# Patient Record
Sex: Female | Born: 1990 | Race: White | Hispanic: No | Marital: Single | Smoking: Never smoker
Health system: Southern US, Community
[De-identification: ages and names within clinical notes are randomized; demographics above are authoritative.]

## PROBLEM LIST (undated history)

## (undated) DIAGNOSIS — U071 COVID-19: Secondary | ICD-10-CM

## (undated) DIAGNOSIS — Z789 Other specified health status: Secondary | ICD-10-CM

## (undated) HISTORY — DX: Other specified health status: Z78.9

---
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
## 2011-04-05 ENCOUNTER — Emergency Department
Admission: EM | Admit: 2011-04-05 | Discharge: 2011-04-05 | Disposition: A | Discharge status: Home / Self Care | Payer: Federal, State, Local not specified - PPO | Source: Home / Self Care | Attending: Emergency Medicine | Admitting: Emergency Medicine

## 2011-04-05 ENCOUNTER — Encounter: Payer: Self-pay | Admitting: *Deleted

## 2011-04-05 ENCOUNTER — Other Ambulatory Visit: Payer: Self-pay | Admitting: Emergency Medicine

## 2011-04-05 DIAGNOSIS — J029 Acute pharyngitis, unspecified: Secondary | ICD-10-CM

## 2011-04-05 LAB — POCT RAPID STREP A (OFFICE): Rapid Strep A Screen: NEGATIVE

## 2011-04-05 NOTE — ED Notes (Signed)
Pt c/o sore throat, HA,and sinus problems x 2 days. She also states that she vommited x 1 and was dizzy this morning, resolved after taking a nap. No fever. She has taken Mucinex with no relief.

## 2011-04-05 NOTE — ED Provider Notes (Signed)
History     CSN: [id number] Arrival date & time: 04/05/2011  4:07 PM   First MD Initiated Contact with Patient 04/05/11 1616      Chief Complaint  Patient presents with  . Sore Throat    (Consider location/radiation/quality/duration/timing/severity/associated sxs/prior treatment) Patient is a 20 y.o. female presenting with pharyngitis. The history is provided by the patient and a parent.  Sore Throat This is a new problem. The current episode started 2 days ago (3 days ago). The problem occurs constantly. The problem has not changed since onset.Pertinent negatives include no chest pain, no abdominal pain, no headaches and no shortness of breath. The symptoms are aggravated by nothing. The symptoms are relieved by nothing. She has tried nothing for the symptoms.    History reviewed. No pertinent past medical history.  History reviewed. No pertinent past surgical history.  No family history on file.  History  Substance Use Topics  . Smoking status: Not on file  . Smokeless tobacco: Not on file  . Alcohol Use: Not on file    OB History    Grav Para Term Preterm Abortions TAB SAB Ect Mult Living                  Review of Systems  Constitutional: Negative for fever and chills.  HENT: Positive for congestion. Negative for rhinorrhea.   Eyes: Negative for discharge.  Respiratory: Negative for shortness of breath.   Cardiovascular: Negative for chest pain.  Gastrointestinal: Negative for abdominal pain and abdominal distention.       Vom x 1 this am. No abdom pain or other gi symptoms.  Genitourinary: Negative.   Musculoskeletal: Negative for back pain, joint swelling and arthralgias.  Neurological: Negative for headaches.    Allergies  Review of patient's allergies indicates no known allergies.  Home Medications  No current outpatient prescriptions on file.  BP 118/78  Pulse 92  Temp(Src) 98.7 F (37.1 C) (Oral)  Resp 18  Ht 5\' 5"  (1.651 m)  Wt 176 lb 8 oz  (80.06 kg)  BMI 29.37 kg/m2  SpO2 99%  Physical Exam  Nursing note and vitals reviewed. Constitutional: She is oriented to person, place, and time. She appears well-developed and well-nourished. She is cooperative.  Non-toxic appearance. No distress.  HENT:  Head: Normocephalic and atraumatic.  Right Ear: Tympanic membrane, external ear and ear canal normal.  Left Ear: Tympanic membrane, external ear and ear canal normal.  Nose: Nose normal. Right sinus exhibits no maxillary sinus tenderness and no frontal sinus tenderness. Left sinus exhibits no maxillary sinus tenderness and no frontal sinus tenderness.  Mouth/Throat: Mucous membranes are normal. Posterior oropharyngeal edema and posterior oropharyngeal erythema present. No oropharyngeal exudate.  Eyes: Conjunctivae are normal. No scleral icterus.  Neck: Neck supple.  Cardiovascular: Normal rate, regular rhythm and normal heart sounds.   No murmur heard. Pulmonary/Chest: Effort normal and breath sounds normal. No stridor. No respiratory distress. She has no wheezes. She has no rales.  Abdominal: Soft. There is no hepatosplenomegaly. There is no tenderness.  Musculoskeletal: She exhibits no edema.  Lymphadenopathy:    She has cervical adenopathy.       Right cervical: Superficial cervical adenopathy present. No deep cervical and no posterior cervical adenopathy present.      Left cervical: Superficial cervical adenopathy present. No deep cervical and no posterior cervical adenopathy present.  Neurological: She is alert and oriented to person, place, and time.  Skin: Skin is warm and dry.  Psychiatric:  She has a normal mood and affect.    ED Course  Procedures (including critical care time)   Labs Reviewed  POCT RAPID STREP A (OFFICE)  Negative  No results found.   No diagnosis found.    MDM  Likely viral pharyngitiis. Send off throat cx. Symptomatic care discussed with mother. If no better in 4 days, see PCP. May need  mono test. Options discussed. Risks, benefits, alternatives discussed. Patient voiced understanding and agreement.        Lonell Face, MD 04/05/11 [phone]

## 2011-04-06 LAB — STREP A DNA PROBE: GASP: NEGATIVE

## 2011-09-09 ENCOUNTER — Emergency Department
Admission: EM | Admit: 2011-09-09 | Discharge: 2011-09-09 | Disposition: A | Discharge status: Home / Self Care | Payer: Federal, State, Local not specified - PPO | Source: Home / Self Care | Attending: Emergency Medicine | Admitting: Emergency Medicine

## 2011-09-09 ENCOUNTER — Encounter: Payer: Self-pay | Admitting: Emergency Medicine

## 2011-09-09 DIAGNOSIS — J029 Acute pharyngitis, unspecified: Secondary | ICD-10-CM

## 2011-09-09 DIAGNOSIS — J069 Acute upper respiratory infection, unspecified: Secondary | ICD-10-CM

## 2011-09-09 LAB — POCT INFLUENZA A/B: Influenza B, POC: NEGATIVE

## 2011-09-09 MED ORDER — AMOXICILLIN 875 MG PO TABS: 875.0000 mg | ORAL_TABLET | Freq: Two times a day (BID) | ORAL | Status: AC

## 2011-09-09 NOTE — ED Provider Notes (Signed)
History     CSN: [id number]  Arrival date & time 09/09/11  1755   First MD Initiated Contact with Patient 09/09/11 1759      Chief Complaint  Patient presents with  . Sore Throat    (Consider location/radiation/quality/duration/timing/severity/associated sxs/prior treatment) HPI Katrina Robles is a 21 y.o. female who complains of onset of cold symptoms for 1 days.  The symptoms are constant and moderate in severity.  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  She did have a flu shot this year.  She has not been using any medications.  She has a history of multiple episodes of strep throat in the past. ++ sore throat +/- cough No pleuritic pain No wheezing No nasal congestion No post-nasal drainage No sinus pain/pressure No chest congestion No itchy/red eyes No earache No hemoptysis No SOB No chills/sweats + fever (102-103) No nausea No vomiting No abdominal pain No diarrhea No skin rashes + fatigue No myalgias + headache    History reviewed. No pertinent past medical history.  History reviewed. No pertinent past surgical history.  Family History  Problem Relation Age of Onset  . Polycystic ovary syndrome Mother     History  Substance Use Topics  . Smoking status: Never Smoker   . Smokeless tobacco: Not on file  . Alcohol Use: No    OB History    Grav Para Term Preterm Abortions TAB SAB Ect Mult Living                  Review of Systems  All other systems reviewed and are negative.    Allergies  Review of patient's allergies indicates not on file.  Home Medications   Current Outpatient Rx  Name Route Sig Dispense Refill  . AMOXICILLIN 875 MG PO TABS Oral Take 1 tablet (875 mg total) by mouth 2 (two) times daily. 14 tablet 0    BP 102/68  Pulse 117  Temp(Src) 102.6 F (39.2 C) (Oral)  Resp 18  Ht 5\' 5"  (1.651 m)  Wt 176 lb (79.833 kg)  BMI 29.29 kg/m2  SpO2 99%  Physical Exam  Nursing note and vitals  reviewed. Constitutional: She is oriented to person, place, and time. She appears well-developed and well-nourished.  Non-toxic appearance. She appears ill. No distress.  HENT:  Head: Normocephalic and atraumatic.  Right Ear: Tympanic membrane, external ear and ear canal normal.  Left Ear: Tympanic membrane, external ear and ear canal normal.  Nose: Nose normal.  Mouth/Throat: Posterior oropharyngeal erythema present. No oropharyngeal exudate or posterior oropharyngeal edema.  Eyes: No scleral icterus.  Neck: Neck supple.  Cardiovascular: Regular rhythm and normal heart sounds.   Pulmonary/Chest: Effort normal and breath sounds normal. No respiratory distress. She has no decreased breath sounds. She has no wheezes. She has no rhonchi.  Neurological: She is alert and oriented to person, place, and time.  Skin: Skin is warm and dry.  Psychiatric: She has a normal mood and affect. Her speech is normal.    ED Course  Procedures (including critical care time)  Labs Reviewed - No data to display No results found.   1. Acute upper respiratory infections of unspecified site   2. Acute pharyngitis       MDM   A rapid strep test done was negative.  Flu test was done and is negative as well.  Her symptoms are consistent with strep throat despite the negative test.  Take the prescribed antibiotic as instructed.  She declined a Bicillin shot. Use nasal saline solution (over the counter) at least 3 times a day. Can take tylenol every 6 hours or motrin every 8 hours for pain or fever.  If the pain with swallowing continues for more than another day or 2, then a prescription for prednisone may be reasonable.  In addition if the symptoms continue despite the antibiotics then a Monospot may also be reasonable at that time. Follow up with your primary doctor if no improvement in 5-7 days, sooner if increasing pain, fever, or new symptoms.     Marlaine Hind, MD 09/09/11 [phone]

## 2011-09-09 NOTE — ED Notes (Signed)
Sore throat, fever since last night.

## 2011-11-05 ENCOUNTER — Other Ambulatory Visit: Payer: Self-pay | Admitting: Neurological Surgery

## 2011-11-05 DIAGNOSIS — D497 Neoplasm of unspecified behavior of endocrine glands and other parts of nervous system: Secondary | ICD-10-CM

## 2012-05-01 ENCOUNTER — Encounter: Payer: Self-pay | Admitting: Family Medicine

## 2012-05-01 ENCOUNTER — Ambulatory Visit (INDEPENDENT_AMBULATORY_CARE_PROVIDER_SITE_OTHER): Payer: Federal, State, Local not specified - PPO | Admitting: Family Medicine

## 2012-05-01 VITALS — BP 102/60 | HR 97 | Ht 65.0 in | Wt 175.0 lb

## 2012-05-01 DIAGNOSIS — Z23 Encounter for immunization: Secondary | ICD-10-CM

## 2012-05-01 DIAGNOSIS — R21 Rash and other nonspecific skin eruption: Secondary | ICD-10-CM

## 2012-05-01 MED ORDER — PREDNISONE 20 MG PO TABS: 20.0000 mg | ORAL_TABLET | Freq: Two times a day (BID) | ORAL | Status: DC

## 2012-05-01 NOTE — Progress Notes (Signed)
  Subjective:    Patient ID: Katrina Robles, female    DOB: 1991-01-02, 21 y.o.   MRN: [id number]  HPI Rash x 2 weeks. Some itching. Tried antihistamine but no relief. Not on any medications. Hx of eczema.  She denies any new perfumes, soaps, detergents etc.   Review of Systems     Objective:   Physical Exam  Constitutional: She appears well-developed and well-nourished.  HENT:  Head: Normocephalic and atraumatic.  Skin: Skin is warm and dry.       She has fine scattered erythematous papules and pustules on her torso, legs, arms. None on her feet or face. Some excoriations.  Psychiatric: She has a normal mood and affect. Her behavior is normal.   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        Assessment & Plan:  Rash - suspect postural eczema versus allergic reaction. Discussed going to dye free and perfumes creams soaps, detergents, lotions et Karie Soda. Also make sure moisturizing the skin well. We will also get a CBC with differential to see if her cycles are elevated. We'll go ahead and treat with prednisone 40 mg daily x5 days. Call if symptoms not resolved.

## 2012-05-01 NOTE — Addendum Note (Signed)
Addended by: Judie Petit A on: 05/01/2012 03:15 PM   Modules accepted: Orders

## 2012-05-01 NOTE — Patient Instructions (Addendum)
Change to a dye free and perfume free soap such as pHisoDerm, or Aveeno. Also recommend change to a coffee perfume free lotion. TheAveeno and Cetaphil make good once. Avoid using puff sponges or scrubers. Use a clean wash cloth each time and make sure washing it in hot water.  Go to a dye free and perfume free detergent and fabric softener.

## 2012-05-02 LAB — CBC WITH DIFFERENTIAL/PLATELET
Basophils Relative: 0 % (ref 0–1)
Eosinophils Absolute: 0.3 10*3/uL (ref 0.0–0.7)
Eosinophils Relative: 3 % (ref 0–5)
HCT: 41 % (ref 36.0–46.0)
Hemoglobin: 13.5 g/dL (ref 12.0–15.0)
MCH: 28.8 pg (ref 26.0–34.0)
MCHC: 32.9 g/dL (ref 30.0–36.0)
MCV: 87.4 fL (ref 78.0–100.0)
Monocytes Absolute: 0.7 10*3/uL (ref 0.1–1.0)
Monocytes Relative: 9 % (ref 3–12)

## 2012-06-12 ENCOUNTER — Telehealth: Payer: Self-pay | Admitting: *Deleted

## 2012-06-12 NOTE — Telephone Encounter (Signed)
Mom calls and wants to know if you can prescribe a med to treat scabies. The rash that both her and daughter had is scabies- her nephew that lives with them was just diagnosed with scabies.

## 2012-06-13 MED ORDER — PERMETHRIN 5 % EX CREA: TOPICAL_CREAM | Freq: Once | CUTANEOUS | Status: DC

## 2012-06-13 NOTE — Telephone Encounter (Signed)
rx sent

## 2012-06-13 NOTE — Addendum Note (Signed)
Addended by: Nani Gasser D on: 06/13/2012 12:22 PM   Modules accepted: Orders

## 2018-05-31 NOTE — L&D Delivery Note (Signed)
Delivery Note   Patient Name: Katrina Robles DOB: November 08, 1990 MRN: [id number]  Date of admission: 05/18/2019 Delivering MD: Noralyn Pick  Date of delivery: 05/19/19 Type of delivery: SVD  Newborn Data: Live born female  Birth Weight:   APGAR: 8, 9  Newborn Delivery   Birth date/time: 05/19/2019 08:56:57 Delivery type: Vaginal, Spontaneous     [name], 28 y.o., @ [redacted]w[redacted]d,  G1P0, who was admitted for IOL for obesity BMI 42, pt has covid+ for x 1 week. I was called to the room when she progressed +2 station in the second stage of labor.  She pushed for 45/min.  She delivered a viable infant, cephalic and restituted to the ROA position over an intact perineum.  A nuchal cord   was identified around neck and body together. The baby was placed on maternal abdomen while initial step of NRP were perfmored (Dry, Stimulated, and warmed). Hat placed on baby for thermoregulation. Delayed cord clamping was performed for 1.5 minutes.  Cord double clamped and cut.  Cord cut by father. Apgar scores were 9 and 9. Prophylactic Pitocin was started in the third stage of labor for active management. The placenta delivered spontaneously, shultz, with a 3 vessel cord and was sent to LD.  Inspection revealed 2nd degree and right perilabial. The perilabial was hemostatic and did not require a repair, the second degree could have had a sulcus tear component to it, repair required, rectal exam performed, rectus intact and sutures not felt in the rectum. An examination of the vaginal vault and cervix was free from lacerations. The uterus was firm, bleeding stable.  The repair was done under epidural.   Placenta and umbilical artery blood gas were not sent.  There were no complications during the procedure.  Mom and baby skin to skin following delivery. Left in stable condition.  Maternal Info: Anesthesia: Epidural Episiotomy: no Lacerations:  2nd degree, right perilabial Suture Repair: 3.0  vicry CT-1 Est. Blood Loss (mL):  376  Newborn Info:  Baby Sex: femal Babies Name: Lillianna APGAR (1 MIN): 9   APGAR (5 MINS): 9   APGAR (10 MINS):     Mom to postpartum.  Baby to Couplet care / Skin to Skin.  DR Nelda Marseille aware of birth    [place], [place], NP-C 05/19/19 10:01 AM

## 2018-12-13 LAB — OB RESULTS CONSOLE ANTIBODY SCREEN: Antibody Screen: NEGATIVE

## 2018-12-13 LAB — OB RESULTS CONSOLE HIV ANTIBODY (ROUTINE TESTING): HIV: NONREACTIVE

## 2018-12-13 LAB — OB RESULTS CONSOLE ABO/RH: RH Type: POSITIVE

## 2018-12-13 LAB — OB RESULTS CONSOLE HEPATITIS B SURFACE ANTIGEN: Hepatitis B Surface Ag: NEGATIVE

## 2018-12-13 LAB — OB RESULTS CONSOLE GC/CHLAMYDIA
Chlamydia: POSITIVE
Gonorrhea: NEGATIVE

## 2018-12-13 LAB — OB RESULTS CONSOLE RUBELLA ANTIBODY, IGM: Rubella: IMMUNE

## 2018-12-13 LAB — OB RESULTS CONSOLE RPR: RPR: NONREACTIVE

## 2019-04-13 LAB — OB RESULTS CONSOLE GBS: GBS: NEGATIVE

## 2019-05-04 LAB — OB RESULTS CONSOLE GC/CHLAMYDIA
Chlamydia: NEGATIVE
Gonorrhea: NEGATIVE

## 2019-05-09 ENCOUNTER — Telehealth (HOSPITAL_COMMUNITY): Payer: Self-pay | Admitting: *Deleted

## 2019-05-09 ENCOUNTER — Encounter (HOSPITAL_COMMUNITY): Payer: Self-pay | Admitting: *Deleted

## 2019-05-09 NOTE — Telephone Encounter (Signed)
Preadmission screen  

## 2019-05-10 ENCOUNTER — Telehealth (HOSPITAL_COMMUNITY): Payer: Self-pay | Admitting: *Deleted

## 2019-05-10 ENCOUNTER — Encounter (HOSPITAL_COMMUNITY): Payer: Self-pay | Admitting: *Deleted

## 2019-05-10 NOTE — Telephone Encounter (Signed)
Preadmission screen  

## 2019-05-16 ENCOUNTER — Other Ambulatory Visit: Payer: Self-pay | Admitting: Obstetrics and Gynecology

## 2019-05-16 ENCOUNTER — Other Ambulatory Visit (HOSPITAL_COMMUNITY)
Admission: RE | Admit: 2019-05-16 | Discharge: 2019-05-16 | Disposition: A | Discharge status: Home / Self Care | Payer: Medicaid Other | Source: Ambulatory Visit | Attending: Obstetrics & Gynecology | Admitting: Obstetrics & Gynecology

## 2019-05-16 DIAGNOSIS — U071 COVID-19: Secondary | ICD-10-CM | POA: Insufficient documentation

## 2019-05-16 DIAGNOSIS — Z01812 Encounter for preprocedural laboratory examination: Secondary | ICD-10-CM | POA: Diagnosis present

## 2019-05-16 LAB — SARS CORONAVIRUS 2 (TAT 6-24 HRS): SARS Coronavirus 2: POSITIVE — AB

## 2019-05-17 NOTE — Progress Notes (Signed)
Attempt x3 to speak to someone form the office of Dr. Viona Gilmore. [name] regarding a positive covid result for this pt. LVM with the assistant of Dr. [name].

## 2019-05-18 ENCOUNTER — Other Ambulatory Visit: Payer: Self-pay

## 2019-05-18 ENCOUNTER — Encounter (HOSPITAL_COMMUNITY): Payer: Self-pay | Admitting: Obstetrics and Gynecology

## 2019-05-18 ENCOUNTER — Inpatient Hospital Stay (HOSPITAL_COMMUNITY): Payer: Medicaid Other

## 2019-05-18 ENCOUNTER — Encounter: Payer: Self-pay | Admitting: Medical

## 2019-05-18 ENCOUNTER — Inpatient Hospital Stay (HOSPITAL_COMMUNITY)
Admission: AD | Admit: 2019-05-18 | Discharge: 2019-05-20 | DRG: 805 | Disposition: A | Discharge status: Home / Self Care | Payer: Medicaid Other | Attending: Obstetrics & Gynecology | Admitting: Obstetrics & Gynecology

## 2019-05-18 DIAGNOSIS — Z3A40 40 weeks gestation of pregnancy: Secondary | ICD-10-CM | POA: Diagnosis not present

## 2019-05-18 DIAGNOSIS — O99344 Other mental disorders complicating childbirth: Secondary | ICD-10-CM | POA: Diagnosis present

## 2019-05-18 DIAGNOSIS — O9852 Other viral diseases complicating childbirth: Secondary | ICD-10-CM | POA: Diagnosis present

## 2019-05-18 DIAGNOSIS — F419 Anxiety disorder, unspecified: Secondary | ICD-10-CM | POA: Diagnosis present

## 2019-05-18 DIAGNOSIS — U071 COVID-19: Secondary | ICD-10-CM | POA: Diagnosis present

## 2019-05-18 DIAGNOSIS — O99214 Obesity complicating childbirth: Principal | ICD-10-CM | POA: Diagnosis present

## 2019-05-18 DIAGNOSIS — O99210 Obesity complicating pregnancy, unspecified trimester: Secondary | ICD-10-CM | POA: Diagnosis present

## 2019-05-18 DIAGNOSIS — O99213 Obesity complicating pregnancy, third trimester: Secondary | ICD-10-CM

## 2019-05-18 HISTORY — DX: Obesity complicating pregnancy, third trimester: O99.213

## 2019-05-18 LAB — TYPE AND SCREEN
ABO/RH(D): A POS
Antibody Screen: NEGATIVE

## 2019-05-18 LAB — RPR: RPR Ser Ql: NONREACTIVE

## 2019-05-18 LAB — COMPREHENSIVE METABOLIC PANEL
ALT: 19 U/L (ref 0–44)
AST: 16 U/L (ref 15–41)
Albumin: 2.5 g/dL — ABNORMAL LOW (ref 3.5–5.0)
Alkaline Phosphatase: 149 U/L — ABNORMAL HIGH (ref 38–126)
Anion gap: 12 (ref 5–15)
BUN: 8 mg/dL (ref 6–20)
CO2: 16 mmol/L — ABNORMAL LOW (ref 22–32)
Calcium: 8.7 mg/dL — ABNORMAL LOW (ref 8.9–10.3)
Chloride: 108 mmol/L (ref 98–111)
Creatinine, Ser: 0.59 mg/dL (ref 0.44–1.00)
GFR calc Af Amer: >60 mL/min (ref >60)
GFR calc non Af Amer: >60 mL/min (ref >60)
Glucose, Bld: 84 mg/dL (ref 70–99)
Potassium: 4.2 mmol/L (ref 3.5–5.1)
Sodium: 136 mmol/L (ref 135–145)
Total Bilirubin: 0.4 mg/dL (ref 0.3–1.2)
Total Protein: 5.7 g/dL — ABNORMAL LOW (ref 6.5–8.1)

## 2019-05-18 LAB — CBC
HCT: 33.1 % — ABNORMAL LOW (ref 36.0–46.0)
Hemoglobin: 10.6 g/dL — ABNORMAL LOW (ref 12.0–15.0)
MCH: 27.8 pg (ref 26.0–34.0)
MCHC: 32 g/dL (ref 30.0–36.0)
MCV: 86.9 fL (ref 80.0–100.0)
Platelets: 198 10*3/uL (ref 150–400)
RBC: 3.81 MIL/uL — ABNORMAL LOW (ref 3.87–5.11)
RDW: 13.4 % (ref 11.5–15.5)
WBC: 5.9 10*3/uL (ref 4.0–10.5)
nRBC: 0 % (ref 0.0–0.2)

## 2019-05-18 LAB — PROTEIN / CREATININE RATIO, URINE
Creatinine, Urine: 24.58 mg/dL
Total Protein, Urine: <6 mg/dL

## 2019-05-18 LAB — ABO/RH: ABO/RH(D): A POS

## 2019-05-18 MED ORDER — DIPHENHYDRAMINE HCL 50 MG/ML IJ SOLN: 12.5000 mg | INTRAMUSCULAR | Status: DC | PRN

## 2019-05-18 MED ORDER — OXYTOCIN BOLUS FROM INFUSION
500.0000 mL | Freq: Once | INTRAVENOUS | Status: AC
  Administered 2019-05-19: 500 mL via INTRAVENOUS

## 2019-05-18 MED ORDER — SOD CITRATE-CITRIC ACID 500-334 MG/5ML PO SOLN: 30.0000 mL | ORAL | Status: DC | PRN

## 2019-05-18 MED ORDER — BUTORPHANOL TARTRATE 1 MG/ML IJ SOLN
2.0000 mg | INTRAMUSCULAR | Status: DC | PRN
  Administered 2019-05-18: 2 mg via INTRAVENOUS
  Filled 2019-05-18: qty 2

## 2019-05-18 MED ORDER — MISOPROSTOL 25 MCG QUARTER TABLET
25.0000 ug | ORAL_TABLET | ORAL | Status: DC | PRN
  Administered 2019-05-18 (×5): 25 ug via VAGINAL
  Filled 2019-05-18 (×4): qty 1

## 2019-05-18 MED ORDER — LACTATED RINGERS IV SOLN
INTRAVENOUS | Status: DC
  Administered 2019-05-18 – 2019-05-19 (×2): 125 mL/h via INTRAVENOUS

## 2019-05-18 MED ORDER — FENTANYL-BUPIVACAINE-NACL 0.5-0.125-0.9 MG/250ML-% EP SOLN
12.0000 mL/h | EPIDURAL | Status: DC | PRN
  Filled 2019-05-18: qty 250

## 2019-05-18 MED ORDER — ACETAMINOPHEN 325 MG PO TABS: 650.0000 mg | ORAL_TABLET | ORAL | Status: DC | PRN

## 2019-05-18 MED ORDER — OXYTOCIN 40 UNITS IN NORMAL SALINE INFUSION - SIMPLE MED
2.5000 [IU]/h | INTRAVENOUS | Status: DC
  Filled 2019-05-18: qty 1000

## 2019-05-18 MED ORDER — EPHEDRINE 5 MG/ML INJ: 10.0000 mg | INTRAVENOUS | Status: DC | PRN

## 2019-05-18 MED ORDER — LACTATED RINGERS IV SOLN
500.0000 mL | Freq: Once | INTRAVENOUS | Status: AC
  Administered 2019-05-19: 500 mL via INTRAVENOUS

## 2019-05-18 MED ORDER — TERBUTALINE SULFATE 1 MG/ML IJ SOLN: 0.2500 mg | Freq: Once | INTRAMUSCULAR | Status: DC | PRN

## 2019-05-18 MED ORDER — OXYCODONE-ACETAMINOPHEN 5-325 MG PO TABS: 1.0000 | ORAL_TABLET | ORAL | Status: DC | PRN

## 2019-05-18 MED ORDER — OXYTOCIN 40 UNITS IN NORMAL SALINE INFUSION - SIMPLE MED
1.0000 m[IU]/min | INTRAVENOUS | Status: DC
  Administered 2019-05-19: 1 m[IU]/min via INTRAVENOUS
  Filled 2019-05-18: qty 1000

## 2019-05-18 MED ORDER — ONDANSETRON HCL 4 MG/2ML IJ SOLN: 4.0000 mg | Freq: Four times a day (QID) | INTRAMUSCULAR | Status: DC | PRN

## 2019-05-18 MED ORDER — OXYCODONE-ACETAMINOPHEN 5-325 MG PO TABS: 2.0000 | ORAL_TABLET | ORAL | Status: DC | PRN

## 2019-05-18 MED ORDER — BUTORPHANOL TARTRATE 1 MG/ML IJ SOLN
2.0000 mg | Freq: Once | INTRAMUSCULAR | Status: AC
  Administered 2019-05-18: 2 mg via INTRAVENOUS
  Filled 2019-05-18: qty 2

## 2019-05-18 MED ORDER — MISOPROSTOL 25 MCG QUARTER TABLET
ORAL_TABLET | ORAL | Status: AC
  Filled 2019-05-18: qty 1

## 2019-05-18 MED ORDER — FENTANYL CITRATE (PF) 100 MCG/2ML IJ SOLN: 50.0000 ug | INTRAMUSCULAR | Status: DC | PRN

## 2019-05-18 MED ORDER — LIDOCAINE HCL (PF) 1 % IJ SOLN
30.0000 mL | INTRAMUSCULAR | Status: DC | PRN
  Filled 2019-05-18: qty 30

## 2019-05-18 MED ORDER — PHENYLEPHRINE 40 MCG/ML (10ML) SYRINGE FOR IV PUSH (FOR BLOOD PRESSURE SUPPORT): 80.0000 ug | PREFILLED_SYRINGE | INTRAVENOUS | Status: DC | PRN

## 2019-05-18 MED ORDER — LACTATED RINGERS IV SOLN: 500.0000 mL | INTRAVENOUS | Status: DC | PRN

## 2019-05-18 MED ORDER — PHENYLEPHRINE 40 MCG/ML (10ML) SYRINGE FOR IV PUSH (FOR BLOOD PRESSURE SUPPORT)
80.0000 ug | PREFILLED_SYRINGE | INTRAVENOUS | Status: DC | PRN
  Filled 2019-05-18: qty 10

## 2019-05-18 NOTE — Progress Notes (Signed)
Katrina Robles is a 28 y.o. G1P0 at [redacted]w[redacted]d. IOL for obesity.  Cytotec given x 5 doses, tolerated well. COVID pos 12/16. Mild symptoms to include congestion  Subjective: Pt rested well today. Mild contractions  Objective: BP 117/72   Pulse 69   Temp 98.2 F (36.8 C) (Oral)   Resp 18   Ht 5\' 5"  (1.651 m)   Wt 115.3 kg   LMP 07/11/2018   BMI 42.28 kg/m   FHT:  Cat 1 tracing UC:   irregular, every 5 minutes SVE:   Dilation: 1 Effacement (%): 70 Station: -2 Exam by:: Reola Buckles CNM Bedside performed by J. Zoha Spranger for presentation. Vertex  Pt pre-medicated with stadol. Cooks catheter inserted. Inflated with 80/80. PT tolerated well.   Assessment / Plan: Cooks Catheter inserted without difficulty. Inflated 80/80 Begin ripening pitocin at 0000 Pain control as desires  Beatrix Fetters, CNM 05/18/2019, 8:42 PM

## 2019-05-18 NOTE — Progress Notes (Signed)
Subjective: Pt is sleeping throughout night.  Denies pain.  Objective: BP (!) 102/57   Pulse 65   Temp 97.9 F (36.6 C) (Oral)   Resp 18   Ht 5\' 5"  (1.651 m)   Wt 115.3 kg   LMP 07/11/2018   BMI 42.28 kg/m  No intake/output data recorded. No intake/output data recorded.  FHT: Category 1 FHT 120 accels noted.  No decels. UC:   None SVE:   Dilation: Closed Effacement (%): Thick Station: -3 Exam by:: Javier Docker RN   Assessment:  28 yo G1P0 at 40 weeks induction for increased BMI Cat 1 strip Covid positive  Plan: Continue with cytotec  Starla Link CNM, MSN 05/18/2019, 6:46 AM

## 2019-05-18 NOTE — H&P (Addendum)
Katrina Robles is a 28 y.o. female, G1P0 at 12 weeks, presenting for induction of labor due to increased BMI.  Prenatal hx remarkable for late entry into prenatal care at 64 weeks with unknown LMP.  US showed EDC of 05/18/2019 at 17 4/7.  Pt was found to be Covid positive on preadmission labs.  She had Clamydia during the pregnancy but toc and at 36 weeks the culture was negative. She is a smoker and has anxiety.  She is on no current medication for her anxiety.  FM+  Patient Active Problem List   Diagnosis Date Noted  . 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 IRREGULAR MENSES 07/31/2008    History of present pregnancy: Patient entered care at 17.4 weeks.   EDC of 05/18/2019 was established by [place].   Anatomy scan:  21.4 weeks, with normal findings and an posterior placenta.   Additional [place] evaluations: growth [place]  At 33 weeks 53% with normal AFI Significant prenatal events:  Covid positive on 05/16/2019 Last evaluation: Yesterday  OB History    Gravida  1   Para      Term      Preterm      AB      Living        SAB      TAB      Ectopic      Multiple      Live Births             Past Medical History:  Diagnosis Date  . Medical history non-contributory    Past Surgical History:  Procedure Laterality Date  . NO PAST SURGERIES     Family History: family history includes Polycystic ovary syndrome in her mother. Social History:  reports that she has never smoked. She has never used smokeless tobacco. She reports that she does not drink alcohol or use drugs.   Prenatal Transfer Tool  Maternal Diabetes: No Genetic Screening: Normal Maternal Ultrasounds/Referrals: Normal Fetal Ultrasounds or other Referrals:  None Maternal Substance Abuse:  Yes:  Type: Smoker Significant Maternal Medications:  None Significant Maternal Lab Results: Group B Strep negative  COVID positive  TDAP Yes Flu Yes  ROS: All 10 systems reviewed and negative except as stated above No  Known Allergies     Blood pressure (!) 144/75, pulse 88, temperature 98.2 F (36.8 C), temperature source Oral, resp. rate 20, height 5\' 5"  (1.651 m), weight 115.3 kg, last menstrual period 07/11/2018.  Chest clear Heart RRR without murmur Abd gravid, NT, FH appropriate Pelvic:Per RN Ext: Negative  FHR: Category 1  FHT 120 accels noted no decels  UCs:  Occ contractions.  Prenatal labs: ABO, Rh: A/Positive/-- (07/15 0000) Antibody: Negative (07/15 0000) Rubella:  Immune (07/15 0000) RPR: Nonreactive (07/15 0000)  HBsAg: Negative (07/15 0000)  HIV: Non-reactive (07/15 0000)  GBS: Negative/-- (11/13 0000) Sickle cell/Hgb electrophoresis:AA GC: Neg Chlamydia: positive repeat at 36 weeks negative Genetic screenings:  NIPT negative AFP negative Glucola:  120 Other:   Hgb 12.2 at NOB, 11 at 28 weeks       Assessment/Plan: Pt is a 28 yo G1P0 at 40 weeks with BMI of 42.28 presents for induction of labor due to increased BMI Cat 1 strip Covid positive   Plan: Admit to [facility] per [facility] Routine [facility] orders Negative pressure room Pain med/epidural prn Cytotec for cervical ripening  Pleas Koch ProtheroCNM, MSN 05/18/2019, 12:40 AM

## 2019-05-19 ENCOUNTER — Inpatient Hospital Stay (HOSPITAL_COMMUNITY): Payer: Medicaid Other | Admitting: Anesthesiology

## 2019-05-19 ENCOUNTER — Encounter (HOSPITAL_COMMUNITY): Payer: Self-pay | Admitting: Obstetrics and Gynecology

## 2019-05-19 MED ORDER — SODIUM CHLORIDE (PF) 0.9 % IJ SOLN
INTRAMUSCULAR | Status: DC | PRN
  Administered 2019-05-19: 12 mL/h via EPIDURAL

## 2019-05-19 MED ORDER — ONDANSETRON HCL 4 MG PO TABS: 4.0000 mg | ORAL_TABLET | ORAL | Status: DC | PRN

## 2019-05-19 MED ORDER — ACETAMINOPHEN 325 MG PO TABS
650.0000 mg | ORAL_TABLET | ORAL | Status: DC | PRN
  Administered 2019-05-19: 650 mg via ORAL
  Filled 2019-05-19: qty 2

## 2019-05-19 MED ORDER — DIBUCAINE (PERIANAL) 1 % EX OINT: 1.0000 "application " | TOPICAL_OINTMENT | CUTANEOUS | Status: DC | PRN

## 2019-05-19 MED ORDER — ZOLPIDEM TARTRATE 5 MG PO TABS: 5.0000 mg | ORAL_TABLET | Freq: Every evening | ORAL | Status: DC | PRN

## 2019-05-19 MED ORDER — TETANUS-DIPHTH-ACELL PERTUSSIS 5-2.5-18.5 LF-MCG/0.5 IM SUSP: 0.5000 mL | Freq: Once | INTRAMUSCULAR | Status: DC

## 2019-05-19 MED ORDER — LIDOCAINE-EPINEPHRINE (PF) 2 %-1:200000 IJ SOLN
INTRAMUSCULAR | Status: DC | PRN
  Administered 2019-05-19 (×2): 2 mL via EPIDURAL

## 2019-05-19 MED ORDER — BENZOCAINE-MENTHOL 20-0.5 % EX AERO: 1.0000 "application " | INHALATION_SPRAY | CUTANEOUS | Status: DC | PRN

## 2019-05-19 MED ORDER — PRENATAL MULTIVITAMIN CH
1.0000 | ORAL_TABLET | Freq: Every day | ORAL | Status: DC
  Filled 2019-05-19: qty 1

## 2019-05-19 MED ORDER — IBUPROFEN 600 MG PO TABS
600.0000 mg | ORAL_TABLET | Freq: Four times a day (QID) | ORAL | Status: DC
  Administered 2019-05-19 – 2019-05-20 (×3): 600 mg via ORAL
  Filled 2019-05-19 (×4): qty 1

## 2019-05-19 MED ORDER — WITCH HAZEL-GLYCERIN EX PADS: 1.0000 "application " | MEDICATED_PAD | CUTANEOUS | Status: DC | PRN

## 2019-05-19 MED ORDER — SENNOSIDES-DOCUSATE SODIUM 8.6-50 MG PO TABS
2.0000 | ORAL_TABLET | ORAL | Status: DC
  Administered 2019-05-19: 2 via ORAL
  Filled 2019-05-19: qty 2

## 2019-05-19 MED ORDER — ONDANSETRON HCL 4 MG/2ML IJ SOLN: 4.0000 mg | INTRAMUSCULAR | Status: DC | PRN

## 2019-05-19 MED ORDER — COCONUT OIL OIL: 1.0000 "application " | TOPICAL_OIL | Status: DC | PRN

## 2019-05-19 MED ORDER — DIPHENHYDRAMINE HCL 25 MG PO CAPS: 25.0000 mg | ORAL_CAPSULE | Freq: Four times a day (QID) | ORAL | Status: DC | PRN

## 2019-05-19 MED ORDER — SIMETHICONE 80 MG PO CHEW: 80.0000 mg | CHEWABLE_TABLET | ORAL | Status: DC | PRN

## 2019-05-19 NOTE — Progress Notes (Addendum)
Katrina Robles is a 28 y.o. G1P0 at [redacted]w[redacted]d. IOL for obesity.  Cytotec given x 5 doses, tolerated well. Cooks cath inserted at 2030, out at 0100 SROM at 0338 Epidural in place, comfortable Pitocin infusing at 2 mU COVID pos 12/16. Mild symptoms to include congestion  Subjective: Pt sleeping.   Objective: BP 119/80   Pulse 95   Temp 98.4 F (36.9 C) (Oral)   Resp 19   Ht 5\' 5"  (1.651 m)   Wt 115.3 kg   LMP 07/11/2018   SpO2 100%   BMI 42.28 kg/m   FHT:  Cat 1 tracing Baseline 120's. Mod variability, accels present, early decels present UC:   irregular, every 3-4 minutes SVE:   Dilation: 7 Effacement (%): 90 Station: -1 Exam by:: Chukwuemeka S. RNC     Assessment / Plan: Cooks now out Epidural in place Continue pitocin per protocol  [employer], CNM 05/19/2019, 6:32 AM

## 2019-05-19 NOTE — Anesthesia Postprocedure Evaluation (Signed)
Anesthesia Post Note  Patient: Katrina Robles  Procedure(s) Performed: AN AD HOC LABOR EPIDURAL     Patient location during evaluation: Mother Baby Anesthesia Type: Epidural Level of consciousness: awake and alert Pain management: pain level controlled Vital Signs Assessment: post-procedure vital signs reviewed and stable Respiratory status: spontaneous breathing, nonlabored ventilation and respiratory function stable Cardiovascular status: stable Postop Assessment: no headache, no backache and epidural receding Anesthetic complications: no    Last Vitals:  Vitals:   05/19/19 1140 05/19/19 1300  BP: 122/66 102/75  Pulse: 78 99  Resp: 18 18  Temp: 36.9 C 37.4 C  SpO2:      Last Pain:  Vitals:   05/19/19 1520  TempSrc:   PainSc: Asleep   Pain Goal:                   [name]

## 2019-05-19 NOTE — Anesthesia Preprocedure Evaluation (Addendum)
Anesthesia Evaluation  Patient identified by MRN, date of birth, ID band Patient awake    Reviewed: Allergy & Precautions, NPO status , Patient's Chart, lab work & pertinent test results  Airway Mallampati: III  TM Distance: >3 FB Neck ROM: Full    Dental no notable dental hx.    Pulmonary Current SmokerPatient did not abstain from smoking.,  COVID positive, asymptomatic   Pulmonary exam normal breath sounds clear to auscultation       Cardiovascular negative cardio ROS Normal cardiovascular exam Rhythm:Regular Rate:Normal     Neuro/Psych PSYCHIATRIC DISORDERS Anxiety negative neurological ROS     GI/Hepatic negative GI ROS, Neg liver ROS,   Endo/Other  Morbid obesity  Renal/GU negative Renal ROS  negative genitourinary   Musculoskeletal negative musculoskeletal ROS (+)   Abdominal   Peds  Hematology negative hematology ROS (+)   Anesthesia Other Findings   Reproductive/Obstetrics (+) Pregnancy                             Anesthesia Physical Anesthesia Plan  ASA: III  Anesthesia Plan: Epidural   Post-op Pain Management:    Induction:   PONV Risk Score and Plan: Treatment may vary due to age or medical condition  Airway Management Planned: Natural Airway  Additional Equipment:   Intra-op Plan:   Post-operative Plan:   Informed Consent: I have reviewed the patients History and Physical, chart, labs and discussed the procedure including the risks, benefits and alternatives for the proposed anesthesia with the patient or authorized representative who has indicated his/her understanding and acceptance.       Plan Discussed with: Anesthesiologist  Anesthesia Plan Comments: (Patient identified. Risks, benefits, options discussed with patient including but not limited to bleeding, infection, nerve damage, paralysis, failed block, incomplete pain control, headache, blood  pressure changes, nausea, vomiting, reactions to medication, itching, and post partum back pain. Confirmed with bedside nurse the patient's most recent platelet count. Confirmed with the patient that they are not taking any anticoagulation, have any bleeding history or any family history of bleeding disorders. Patient expressed understanding and wishes to proceed. All questions were answered. )        Anesthesia Quick Evaluation

## 2019-05-19 NOTE — Anesthesia Procedure Notes (Signed)

## 2019-05-20 LAB — CBC
HCT: 30.8 % — ABNORMAL LOW (ref 36.0–46.0)
Hemoglobin: 9.5 g/dL — ABNORMAL LOW (ref 12.0–15.0)
MCH: 27.8 pg (ref 26.0–34.0)
MCHC: 30.8 g/dL (ref 30.0–36.0)
MCV: 90.1 fL (ref 80.0–100.0)
Platelets: 161 10*3/uL (ref 150–400)
RBC: 3.42 MIL/uL — ABNORMAL LOW (ref 3.87–5.11)
RDW: 13.5 % (ref 11.5–15.5)
WBC: 8.1 10*3/uL (ref 4.0–10.5)
nRBC: 0 % (ref 0.0–0.2)

## 2019-05-20 MED ORDER — IBUPROFEN 600 MG PO TABS: 600.0000 mg | ORAL_TABLET | Freq: Four times a day (QID) | ORAL | 0 refills | Status: DC

## 2019-05-20 NOTE — Plan of Care (Signed)
  Problem: Life Cycle: Goal: Ability to make normal progression through stages of labor will improve Outcome: Completed/Met   Problem: Role Relationship: Goal: Will demonstrate positive interactions with the child Outcome: Completed/Met   Problem: Safety: Goal: Risk of complications during labor and delivery will decrease Outcome: Completed/Met   Problem: Pain Management: Goal: Relief or control of pain from uterine contractions will improve Outcome: Completed/Met   Problem: Clinical Measurements: Goal: Diagnostic test results will improve Outcome: Completed/Met Goal: Respiratory complications will improve Outcome: Completed/Met Goal: Cardiovascular complication will be avoided Outcome: Completed/Met   Problem: Activity: Goal: Risk for activity intolerance will decrease Outcome: Completed/Met   Problem: Nutrition: Goal: Adequate nutrition will be maintained Outcome: Completed/Met

## 2019-05-20 NOTE — Discharge Summary (Signed)
SVD OB Discharge Summary     Patient Name: Katrina Robles DOB: [birth year] MRN: [id number]  Date of admission: 05/18/2019 Delivering MD: Noralyn Pick  Date of delivery: 05/19/2019 Type of delivery: SVD  Newborn Data: Sex: Baby female  Live born female  Birth Weight: 7 lb 10.2 oz (3465 g) APGAR: 9, 9  Newborn Delivery   Birth date/time: 05/19/2019 08:56:57 Delivery type: Vaginal, Spontaneous      Feeding: bottle Infant being discharge to home with mother in stable condition.   Admitting diagnosis: Obesity in pregnancy, antepartum [O99.210] Intrauterine pregnancy: [redacted]w[redacted]d     Secondary diagnosis:  Active Problems:   Obesity affecting pregnancy in third trimester   Obesity in pregnancy, antepartum   Normal postpartum course                                Complications: None                                                              Intrapartum Procedures: spontaneous vaginal delivery Postpartum Procedures: none Complications-Operative and Postpartum: 2nd degree perineal laceration Augmentation: Pitocin, Cytotec and Foley Balloon   History of Present Illness: Katrina Robles is a 28 y.o. female, G1P1001, who presents at [redacted]w[redacted]d weeks gestation. The patient has been followed at  [facility]  Her pregnancy has been complicated by:  Patient Active Problem List   Diagnosis Date Noted  . Normal postpartum course 05/20/2019  . Obesity affecting pregnancy in third trimester 05/18/2019  . Obesity in pregnancy, antepartum 05/18/2019  . COVID-19 affecting pregnancy in third trimester 05/18/2019  . 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 IRREGULAR MENSES 07/31/2008    Hospital course:  Induction of Labor With Vaginal Delivery   28 y.o. yo G1P1001 at [redacted]w[redacted]d was admitted to the hospital 05/18/2019 for induction of labor.  Indication for induction: obesity BMI 42, pt also covid positive.  Patient had an uncomplicated labor course as  follows: Membrane Rupture Time/Date: 3:38 AM ,05/19/2019   Intrapartum Procedures: Episiotomy: None [1]                                         Lacerations:  2nd degree [3]  Patient had delivery of a Viable infant.  Information for the patient's newborn:  Yoceline, Linz Girl Audrie [id number]  Delivery Method: Vag-Spont    05/19/2019  Details of delivery can be found in separate delivery note.  Patient had a routine postpartum course. Patient is discharged home 05/20/19. Postpartum Day # 1 : S/P NSVD due to IOl for postdates. Patient up ad lib, denies syncope or dizziness. Reports consuming regular diet without issues and denies N/V. Patient reports 0 bowel movement + passing flatus.  Denies issues with urination and reports bleeding is "lighter."  Patient is bottlefeeding and reports going well.  Desires nexplanon for postpartum contraception.  Pain is being appropriately managed with use of po meds. Pt endorses wanting early discharge today and meets criteria pt has mild symptomatic covid and desires earlyu discharge today., Pt endorses mood stable.    Physical exam  Vitals:   05/19/19  1713 05/19/19 2034 05/19/19 2347 05/20/19 0637  BP: 125/86 99/70 116/69 121/73  Pulse: 80 79 77 73  Resp: 18 18 18 18   Temp: 99.4 F (37.4 C) 98 F (36.7 C) 97.9 F (36.6 C) 97.8 F (36.6 C)  TempSrc: Oral Oral Oral Oral  SpO2: 99%     Weight:      Height:       General: alert, cooperative and no distress Lochia: appropriate Uterine Fundus: firm Perineum: approximate DVT Evaluation: No evidence of DVT seen on physical exam. Negative Homan's sign. No cords or calf tenderness. No significant calf/ankle edema.  Labs: Lab Results  Component Value Date   WBC 8.1 05/20/2019   HGB 9.5 (L) 05/20/2019   HCT 30.8 (L) 05/20/2019   MCV 90.1 05/20/2019   PLT 161 05/20/2019   CMP Latest Ref Rng & Units 05/18/2019  Glucose 70 - 99 mg/dL 84  BUN 6 - 20 mg/dL 8  Creatinine 0.44 - 1.00 mg/dL 0.59   Sodium 135 - 145 mmol/L 136  Potassium 3.5 - 5.1 mmol/L 4.2  Chloride 98 - 111 mmol/L 108  CO2 22 - 32 mmol/L 16(L)  Calcium 8.9 - 10.3 mg/dL 8.7(L)  Total Protein 6.5 - 8.1 g/dL 5.7(L)  Total Bilirubin 0.3 - 1.2 mg/dL 0.4  Alkaline Phos 38 - 126 U/L 149(H)  AST 15 - 41 U/L 16  ALT 0 - 44 U/L 19    Date of discharge: 05/20/2019 Discharge Diagnoses: Term Pregnancy-delivered Discharge instruction: per After Visit Summary and "Baby and Me Booklet".  After visit meds:   Activity:           unrestricted and pelvic rest Advance as tolerated. Pelvic rest for 6 weeks.  Diet:                routine Medications: PNV and Ibuprofen Postpartum contraception: Nexplanon Condition:  Pt discharge to home with baby in stable  Meds: Allergies as of 05/20/2019   No Known Allergies     Medication List    STOP taking these medications   permethrin 5 % cream Commonly known as: ELIMITE   predniSONE 20 MG tablet Commonly known as: DELTASONE     TAKE these medications   ibuprofen 600 MG tablet Commonly known as: ADVIL Take 1 tablet (600 mg total) by mouth every 6 (six) hours.       Discharge Follow Up:  Follow-up [facility]. Schedule an appointment as soon as possible for a visit in 6 week(s).   Specialty: Obstetrics and Gynecology Contact information: [address] [place] [phone] [place], NP-C, CNM 05/20/2019, 8:27 AM  Noralyn Pick, [place]

## 2019-05-23 ENCOUNTER — Encounter (HOSPITAL_COMMUNITY): Payer: Self-pay | Admitting: Emergency Medicine

## 2019-05-23 ENCOUNTER — Inpatient Hospital Stay (HOSPITAL_COMMUNITY)
Admission: EM | Admit: 2019-05-23 | Discharge: 2019-05-27 | DRG: 776 | Disposition: A | Discharge status: Home / Self Care | Payer: Medicaid Other | Attending: Family Medicine | Admitting: Family Medicine

## 2019-05-23 ENCOUNTER — Emergency Department (HOSPITAL_COMMUNITY): Payer: Medicaid Other

## 2019-05-23 ENCOUNTER — Other Ambulatory Visit: Payer: Self-pay

## 2019-05-23 DIAGNOSIS — E669 Obesity, unspecified: Secondary | ICD-10-CM | POA: Diagnosis not present

## 2019-05-23 DIAGNOSIS — Z87891 Personal history of nicotine dependence: Secondary | ICD-10-CM | POA: Diagnosis not present

## 2019-05-23 DIAGNOSIS — O8621 Infection of kidney following delivery: Secondary | ICD-10-CM | POA: Diagnosis present

## 2019-05-23 DIAGNOSIS — E860 Dehydration: Secondary | ICD-10-CM | POA: Diagnosis present

## 2019-05-23 DIAGNOSIS — Z6839 Body mass index (BMI) 39.0-39.9, adult: Secondary | ICD-10-CM | POA: Diagnosis not present

## 2019-05-23 DIAGNOSIS — O85 Puerperal sepsis: Secondary | ICD-10-CM | POA: Diagnosis not present

## 2019-05-23 DIAGNOSIS — O9953 Diseases of the respiratory system complicating the puerperium: Secondary | ICD-10-CM | POA: Diagnosis present

## 2019-05-23 DIAGNOSIS — O9089 Other complications of the puerperium, not elsewhere classified: Secondary | ICD-10-CM | POA: Diagnosis present

## 2019-05-23 DIAGNOSIS — K5900 Constipation, unspecified: Secondary | ICD-10-CM | POA: Diagnosis present

## 2019-05-23 DIAGNOSIS — U071 COVID-19: Secondary | ICD-10-CM | POA: Diagnosis present

## 2019-05-23 DIAGNOSIS — E876 Hypokalemia: Secondary | ICD-10-CM | POA: Diagnosis present

## 2019-05-23 DIAGNOSIS — A419 Sepsis, unspecified organism: Secondary | ICD-10-CM | POA: Diagnosis present

## 2019-05-23 DIAGNOSIS — N12 Tubulo-interstitial nephritis, not specified as acute or chronic: Secondary | ICD-10-CM

## 2019-05-23 DIAGNOSIS — O99215 Obesity complicating the puerperium: Secondary | ICD-10-CM | POA: Diagnosis present

## 2019-05-23 DIAGNOSIS — R509 Fever, unspecified: Secondary | ICD-10-CM | POA: Diagnosis not present

## 2019-05-23 DIAGNOSIS — J1289 Other viral pneumonia: Secondary | ICD-10-CM | POA: Diagnosis present

## 2019-05-23 DIAGNOSIS — Z8349 Family history of other endocrine, nutritional and metabolic diseases: Secondary | ICD-10-CM

## 2019-05-23 DIAGNOSIS — N10 Acute tubulo-interstitial nephritis: Secondary | ICD-10-CM | POA: Diagnosis present

## 2019-05-23 DIAGNOSIS — O9853 Other viral diseases complicating the puerperium: Secondary | ICD-10-CM | POA: Diagnosis present

## 2019-05-23 DIAGNOSIS — J189 Pneumonia, unspecified organism: Secondary | ICD-10-CM

## 2019-05-23 DIAGNOSIS — O98513 Other viral diseases complicating pregnancy, third trimester: Secondary | ICD-10-CM | POA: Diagnosis not present

## 2019-05-23 HISTORY — DX: COVID-19: U07.1

## 2019-05-23 LAB — CBC WITH DIFFERENTIAL/PLATELET
Abs Immature Granulocytes: 0.1 10*3/uL — ABNORMAL HIGH (ref 0.00–0.07)
Basophils Absolute: 0 10*3/uL (ref 0.0–0.1)
Basophils Relative: 0 %
Eosinophils Absolute: 0 10*3/uL (ref 0.0–0.5)
Eosinophils Relative: 0 %
HCT: 32.7 % — ABNORMAL LOW (ref 36.0–46.0)
Hemoglobin: 10.8 g/dL — ABNORMAL LOW (ref 12.0–15.0)
Immature Granulocytes: 1 %
Lymphocytes Relative: 5 %
Lymphs Abs: 0.5 10*3/uL — ABNORMAL LOW (ref 0.7–4.0)
MCH: 28.1 pg (ref 26.0–34.0)
MCHC: 33 g/dL (ref 30.0–36.0)
MCV: 85.2 fL (ref 80.0–100.0)
Monocytes Absolute: 0.8 10*3/uL (ref 0.1–1.0)
Monocytes Relative: 8 %
Neutro Abs: 9.4 10*3/uL — ABNORMAL HIGH (ref 1.7–7.7)
Neutrophils Relative %: 86 %
Platelets: 174 10*3/uL (ref 150–400)
RBC: 3.84 MIL/uL — ABNORMAL LOW (ref 3.87–5.11)
RDW: 14.3 % (ref 11.5–15.5)
WBC: 10.9 10*3/uL — ABNORMAL HIGH (ref 4.0–10.5)
nRBC: 0 % (ref 0.0–0.2)

## 2019-05-23 LAB — COMPREHENSIVE METABOLIC PANEL
ALT: 27 U/L (ref 0–44)
AST: 26 U/L (ref 15–41)
Albumin: 2.3 g/dL — ABNORMAL LOW (ref 3.5–5.0)
Alkaline Phosphatase: 133 U/L — ABNORMAL HIGH (ref 38–126)
Anion gap: 14 (ref 5–15)
BUN: 8 mg/dL (ref 6–20)
CO2: 18 mmol/L — ABNORMAL LOW (ref 22–32)
Calcium: 8.2 mg/dL — ABNORMAL LOW (ref 8.9–10.3)
Chloride: 106 mmol/L (ref 98–111)
Creatinine, Ser: 1.16 mg/dL — ABNORMAL HIGH (ref 0.44–1.00)
GFR calc Af Amer: >60 mL/min (ref >60)
GFR calc non Af Amer: >60 mL/min (ref >60)
Glucose, Bld: 143 mg/dL — ABNORMAL HIGH (ref 70–99)
Potassium: 2.8 mmol/L — ABNORMAL LOW (ref 3.5–5.1)
Sodium: 138 mmol/L (ref 135–145)
Total Bilirubin: 0.8 mg/dL (ref 0.3–1.2)
Total Protein: 6.2 g/dL — ABNORMAL LOW (ref 6.5–8.1)

## 2019-05-23 LAB — TROPONIN I (HIGH SENSITIVITY)
Troponin I (High Sensitivity): 15 ng/L (ref <18)
Troponin I (High Sensitivity): 14 ng/L (ref <18)

## 2019-05-23 LAB — URINALYSIS, ROUTINE W REFLEX MICROSCOPIC
Bilirubin Urine: NEGATIVE
Glucose, UA: NEGATIVE mg/dL
Ketones, ur: 20 mg/dL — AB
Nitrite: POSITIVE — AB
Protein, ur: 100 mg/dL — AB
Specific Gravity, Urine: 1.014 (ref 1.005–1.030)
WBC, UA: >50 WBC/hpf — ABNORMAL HIGH (ref 0–5)
pH: 5 (ref 5.0–8.0)

## 2019-05-23 LAB — LACTIC ACID, PLASMA
Lactic Acid, Venous: 2.3 mmol/L (ref 0.5–1.9)
Lactic Acid, Venous: 1 mmol/L (ref 0.5–1.9)

## 2019-05-23 LAB — MAGNESIUM: Magnesium: 1.3 mg/dL — ABNORMAL LOW (ref 1.7–2.4)

## 2019-05-23 LAB — PROTIME-INR
INR: 1.2 (ref 0.8–1.2)
Prothrombin Time: 14.7 seconds (ref 11.4–15.2)

## 2019-05-23 LAB — D-DIMER, QUANTITATIVE: D-Dimer, Quant: 5.48 ug/mL-FEU — ABNORMAL HIGH (ref 0.00–0.50)

## 2019-05-23 LAB — PROCALCITONIN: Procalcitonin: 15.87 ng/mL

## 2019-05-23 MED ORDER — KETOROLAC TROMETHAMINE 30 MG/ML IJ SOLN
30.0000 mg | Freq: Once | INTRAMUSCULAR | Status: AC
  Administered 2019-05-23: 30 mg via INTRAVENOUS
  Filled 2019-05-23: qty 1

## 2019-05-23 MED ORDER — ENOXAPARIN SODIUM 40 MG/0.4ML ~~LOC~~ SOLN
40.0000 mg | SUBCUTANEOUS | Status: DC
  Administered 2019-05-23 – 2019-05-25 (×3): 40 mg via SUBCUTANEOUS
  Filled 2019-05-23 (×4): qty 0.4

## 2019-05-23 MED ORDER — SODIUM CHLORIDE 0.9 % IV SOLN
500.0000 mg | Freq: Once | INTRAVENOUS | Status: AC
  Administered 2019-05-23: 500 mg via INTRAVENOUS
  Filled 2019-05-23: qty 500

## 2019-05-23 MED ORDER — SODIUM CHLORIDE 0.9 % IV SOLN: 1.0000 g | Freq: Once | INTRAVENOUS | Status: DC

## 2019-05-23 MED ORDER — IOHEXOL 350 MG/ML SOLN
75.0000 mL | Freq: Once | INTRAVENOUS | Status: AC | PRN
  Administered 2019-05-23: 75 mL via INTRAVENOUS

## 2019-05-23 MED ORDER — ACETAMINOPHEN 325 MG PO TABS
650.0000 mg | ORAL_TABLET | Freq: Four times a day (QID) | ORAL | Status: DC | PRN
  Administered 2019-05-24 – 2019-05-26 (×6): 650 mg via ORAL
  Filled 2019-05-23 (×6): qty 2

## 2019-05-23 MED ORDER — MORPHINE SULFATE (PF) 4 MG/ML IV SOLN
4.0000 mg | Freq: Once | INTRAVENOUS | Status: AC
  Administered 2019-05-23: 4 mg via INTRAVENOUS
  Filled 2019-05-23: qty 1

## 2019-05-23 MED ORDER — SODIUM CHLORIDE 0.9 % IV BOLUS
500.0000 mL | Freq: Once | INTRAVENOUS | Status: AC
  Administered 2019-05-23: 500 mL via INTRAVENOUS

## 2019-05-23 MED ORDER — POLYETHYLENE GLYCOL 3350 17 G PO PACK: 17.0000 g | PACK | Freq: Every day | ORAL | Status: DC | PRN

## 2019-05-23 MED ORDER — MAGNESIUM SULFATE IN D5W 1-5 GM/100ML-% IV SOLN
1.0000 g | Freq: Once | INTRAVENOUS | Status: AC
  Administered 2019-05-23: 1 g via INTRAVENOUS
  Filled 2019-05-23 (×2): qty 100

## 2019-05-23 MED ORDER — ACETAMINOPHEN 325 MG PO TABS
650.0000 mg | ORAL_TABLET | Freq: Once | ORAL | Status: AC | PRN
  Administered 2019-05-23: 650 mg via ORAL
  Filled 2019-05-23: qty 2

## 2019-05-23 MED ORDER — POTASSIUM CHLORIDE CRYS ER 20 MEQ PO TBCR
40.0000 meq | EXTENDED_RELEASE_TABLET | Freq: Once | ORAL | Status: AC
  Administered 2019-05-23: 40 meq via ORAL
  Filled 2019-05-23: qty 2

## 2019-05-23 MED ORDER — ACETAMINOPHEN 650 MG RE SUPP: 650.0000 mg | Freq: Four times a day (QID) | RECTAL | Status: DC | PRN

## 2019-05-23 MED ORDER — POTASSIUM CHLORIDE 10 MEQ/100ML IV SOLN
10.0000 meq | Freq: Once | INTRAVENOUS | Status: AC
  Administered 2019-05-23: 10 meq via INTRAVENOUS
  Filled 2019-05-23: qty 100

## 2019-05-23 MED ORDER — ACETAMINOPHEN 325 MG PO TABS
650.0000 mg | ORAL_TABLET | Freq: Once | ORAL | Status: AC
  Administered 2019-05-23: 650 mg via ORAL
  Filled 2019-05-23: qty 2

## 2019-05-23 MED ORDER — SODIUM CHLORIDE 0.9 % IV SOLN: INTRAVENOUS | Status: DC

## 2019-05-23 MED ORDER — ONDANSETRON HCL 4 MG/2ML IJ SOLN
4.0000 mg | Freq: Once | INTRAMUSCULAR | Status: AC
  Administered 2019-05-23: 4 mg via INTRAVENOUS
  Filled 2019-05-23: qty 2

## 2019-05-23 MED ORDER — SODIUM CHLORIDE 0.9 % IV BOLUS
1000.0000 mL | Freq: Once | INTRAVENOUS | Status: AC
  Administered 2019-05-23: 1000 mL via INTRAVENOUS

## 2019-05-23 MED ORDER — ONDANSETRON HCL 4 MG/2ML IJ SOLN: 4.0000 mg | Freq: Four times a day (QID) | INTRAMUSCULAR | Status: DC | PRN

## 2019-05-23 MED ORDER — ONDANSETRON HCL 4 MG PO TABS: 4.0000 mg | ORAL_TABLET | Freq: Four times a day (QID) | ORAL | Status: DC | PRN

## 2019-05-23 MED ORDER — SODIUM CHLORIDE 0.9 % IV SOLN
1.0000 g | Freq: Once | INTRAVENOUS | Status: AC
  Administered 2019-05-23: 1 g via INTRAVENOUS
  Filled 2019-05-23: qty 10

## 2019-05-23 MED ORDER — OXYCODONE HCL 5 MG PO TABS
5.0000 mg | ORAL_TABLET | ORAL | Status: DC | PRN
  Administered 2019-05-25: 5 mg via ORAL
  Filled 2019-05-23: qty 1

## 2019-05-23 NOTE — ED Notes (Signed)
Dr. Pfiefer at bedside  

## 2019-05-23 NOTE — ED Notes (Signed)
Ambulated to restroom  

## 2019-05-23 NOTE — ED Triage Notes (Addendum)
COVID positive , Pt is 4 days post partum with c/o fever and back pain, heart racing

## 2019-05-23 NOTE — ED Provider Notes (Signed)
[facility] EMERGENCY DEPARTMENT Provider Note   CSN: [id number] Arrival date & time: 05/23/19  1307     History Chief Complaint  Patient presents with  . Fever    Katrina Robles is a 28 y.o. female who is 3 days status post partum, Covid positive who presents for evaluation of fever, left-sided pain that began yesterday.  Patient reports that she recently was in the hospital to deliver a baby on 05/20/2019.  As part of her prenatal/preop course, she was tested for Covid and was found to be positive.  She reports she only had congestion.  She states she had felt fine up until yesterday when she started feeling some discomfort in her left side as well as having fever.  She states that the pain in her right side feels like it spasms and feels like she can hardly move.  She states it is worse with deep inspiration.  The pain does not spread.  She feels like she has some mild shortness of breath associated with the pain but no other symptoms.  He denies any chest pain.  She has been taking Tylenol for fever but that has not relieved any of her pain.  Patient is not currently breast-feeding.  She denies any cough, abdominal pain, nausea/vomiting.  The history is provided by the patient.       Past Medical History:  Diagnosis Date  . COVID-19   . Medical history non-contributory     Patient Active Problem List   Diagnosis Date Noted  . Pyelonephritis, acute 05/23/2019  . Normal postpartum course 05/20/2019  . Obesity affecting pregnancy in third trimester 05/18/2019  . Obesity in pregnancy, antepartum 05/18/2019  . COVID-19 affecting pregnancy in third trimester 05/18/2019  . 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 IRREGULAR MENSES 07/31/2008    Past Surgical History:  Procedure Laterality Date  . NO PAST SURGERIES       OB History    Gravida  1   Para  1   Term  1   Preterm      AB      Living  1     SAB      TAB      Ectopic      Multiple  0   Live Births  1           Family History  Problem Relation Age of Onset  . Polycystic ovary syndrome Mother     Social History   Tobacco Use  . Smoking status: Never Smoker  . Smokeless tobacco: Never Used  Substance Use Topics  . Alcohol use: No  . Drug use: No    Home Medications Prior to Admission medications   Not on File    Allergies    Patient has no known allergies.  Review of Systems   Review of Systems  Constitutional: Positive for fatigue and fever.  Respiratory: Positive for shortness of breath. Negative for cough.   Cardiovascular: Negative for chest pain.  Gastrointestinal: Negative for abdominal pain, nausea and vomiting.  Genitourinary: Negative for dysuria and hematuria.  Neurological: Negative for headaches.  All other systems reviewed and are negative.   Physical Exam Updated Vital Signs BP 107/71   Pulse 79   Temp 99 F (37.2 C) (Oral)   Resp (!) 28   Ht 5\' 5"  (1.651 m)   Wt 108.9 kg   SpO2 100%   Breastfeeding Yes Comment: shielded  BMI 39.94 kg/m   Physical  Exam Vitals and nursing note reviewed.  Constitutional:      Appearance: Normal appearance. She is well-developed.  HENT:     Head: Normocephalic and atraumatic.  Eyes:     General: Lids are normal.     Conjunctiva/sclera: Conjunctivae normal.     Pupils: Pupils are equal, round, and reactive to light.  Cardiovascular:     Rate and Rhythm: Regular rhythm. Tachycardia present.     Pulses: Normal pulses.     Heart sounds: Normal heart sounds. No murmur. No friction rub. No gallop.   Pulmonary:     Effort: Pulmonary effort is normal.     Breath sounds: Normal breath sounds.     Comments: Lungs clear to auscultation bilaterally.  Symmetric chest rise.  No wheezing, rales, rhonchi. Chest:       Comments: Patient reports pain to anterior and lateral chest wall.  No tenderness palpation.  No deformity or crepitus noted. The exam was performed with a chaperone  present.  No tenderness, warmth, erythema noted bilateral breast. Abdominal:     Palpations: Abdomen is soft. Abdomen is not rigid.     Tenderness: There is no abdominal tenderness. There is no guarding.     Comments: Abdomen is soft, non-distended, non-tender. No rigidity, No guarding. No peritoneal signs.  Musculoskeletal:        General: Normal range of motion.     Cervical back: Full passive range of motion without pain.  Skin:    General: Skin is warm and dry.     Capillary Refill: Capillary refill takes less than 2 seconds.  Neurological:     Mental Status: She is alert and oriented to person, place, and time.  Psychiatric:        Speech: Speech normal.     ED Results / Procedures / Treatments   Labs (all labs ordered are listed, but only abnormal results are displayed) Labs Reviewed  COMPREHENSIVE METABOLIC PANEL - Abnormal; Notable for the following components:      Result Value   Potassium 2.8 (*)    CO2 18 (*)    Glucose, Bld 143 (*)    Creatinine, Ser 1.16 (*)    Calcium 8.2 (*)    Total Protein 6.2 (*)    Albumin 2.3 (*)    Alkaline Phosphatase 133 (*)    All other components within normal limits  LACTIC ACID, PLASMA - Abnormal; Notable for the following components:   Lactic Acid, Venous 2.3 (*)    All other components within normal limits  CBC WITH DIFFERENTIAL/PLATELET - Abnormal; Notable for the following components:   WBC 10.9 (*)    RBC 3.84 (*)    Hemoglobin 10.8 (*)    HCT 32.7 (*)    Neutro Abs 9.4 (*)    Lymphs Abs 0.5 (*)    Abs Immature Granulocytes 0.10 (*)    All other components within normal limits  URINALYSIS, ROUTINE W REFLEX MICROSCOPIC - Abnormal; Notable for the following components:   Color, Urine AMBER (*)    APPearance CLOUDY (*)    Hgb urine dipstick MODERATE (*)    Ketones, ur 20 (*)    Protein, ur 100 (*)    Nitrite POSITIVE (*)    Leukocytes,Ua LARGE (*)    WBC, UA >50 (*)    Bacteria, UA MANY (*)    All other components  within normal limits  D-DIMER, QUANTITATIVE (NOT AT [facility]) - Abnormal; Notable for the following components:   D-Dimer, Quant  5.48 (*)    All other components within normal limits  MAGNESIUM - Abnormal; Notable for the following components:   Magnesium 1.3 (*)    All other components within normal limits  COMPREHENSIVE METABOLIC PANEL - Abnormal; Notable for the following components:   CO2 21 (*)    Glucose, Bld 105 (*)    Calcium 7.5 (*)    Total Protein 4.8 (*)    Albumin 1.8 (*)    All other components within normal limits  C-REACTIVE PROTEIN - Abnormal; Notable for the following components:   CRP 28.9 (*)    All other components within normal limits  D-DIMER, QUANTITATIVE (NOT AT [facility]) - Abnormal; Notable for the following components:   D-Dimer, Quant 3.25 (*)    All other components within normal limits  CBC WITH DIFFERENTIAL/PLATELET - Abnormal; Notable for the following components:   RBC 3.03 (*)    Hemoglobin 8.4 (*)    HCT 27.2 (*)    Abs Immature Granulocytes 0.09 (*)    All other components within normal limits  CULTURE, BLOOD (ROUTINE X 2)  CULTURE, BLOOD (ROUTINE X 2)  URINE CULTURE  LACTIC ACID, PLASMA  PROTIME-INR  PROCALCITONIN  FERRITIN  HEMOGLOBIN A1C  MAGNESIUM  CBC WITH DIFFERENTIAL/PLATELET  TROPONIN I (HIGH SENSITIVITY)  TROPONIN I (HIGH SENSITIVITY)    EKG EKG Interpretation  Date/Time:  Wednesday May 23 2019 14:43:33 EST Ventricular Rate:  123 PR Interval:    QRS Duration: 80 QT Interval:  312 QTC Calculation: 447 R Axis:   66 Text Interpretation: Sinus tachycardia Borderline T abnormalities, inferior leads No previous ECGs available Confirmed by Wandra Arthurs [id number]) on 05/23/2019 2:54:41 PM   Radiology CT Angio Chest PE W and/or Wo Contrast  Result Date: 05/23/2019 CLINICAL DATA:  Shortness of breath. Four days postpartum. COVID positive. EXAM: CT ANGIOGRAPHY CHEST WITH CONTRAST TECHNIQUE: Multidetector CT imaging of the chest was  performed using the standard protocol during bolus administration of intravenous contrast. Multiplanar CT image reconstructions and MIPs were obtained to evaluate the vascular anatomy. CONTRAST:  82mL OMNIPAQUE IOHEXOL 350 MG/ML SOLN COMPARISON:  One-view chest x-ray 05/23/2019 FINDINGS: Cardiovascular: Heart size is normal. Aortic arch and great vessels are within normal limits. Pulmonary artery opacification is satisfactory. No focal filling defects are present to suggest pulmonary embolus. Pulmonary artery size is normal. Mediastinum/Nodes: No significant mediastinal, hilar, or axillary adenopathy is present. There is heterogeneous asymmetric enlargement of the left lobe of the thyroid. No dominant lesion is present. No follow-up is necessary. Lungs/Pleura: Focal airspace opacification is present the posterior right lower lobe. No other significant consolidation is present. There are no effusions or pneumothorax. Upper lung fields are clear bilaterally. Upper Abdomen: Visualized upper abdomen is within normal limits. Musculoskeletal: Vertebral body heights and alignment are maintained. No focal lytic or blastic lesions are present. Review of the MIP images confirms the above findings. IMPRESSION: 1. No pulmonary embolus. 2. Right lower lobe airspace disease compatible with pneumonia. Electronically Signed   By: San Morelle M.D.   On: 05/23/2019 19:32   CT ABDOMEN PELVIS W CONTRAST  Result Date: 05/23/2019 CLINICAL DATA:  Four days post partum, fever, back pain, heart racing, shortness of breath, question pyelonephritis or abscess; COVID positive EXAM: CT ABDOMEN AND PELVIS WITH CONTRAST TECHNIQUE: Multidetector CT imaging of the abdomen and pelvis was performed using the standard protocol following bolus administration of intravenous contrast. Sagittal and coronal MPR images reconstructed from axial data set. Respiratory motion artifacts degrade exam. CONTRAST:  23mL OMNIPAQUE IOHEXOL 350 MG/ML SOLN  IV. No oral contrast administered. COMPARISON:  None FINDINGS: Lower chest: Mild infiltrate versus atelectasis in posterior RIGHT lower lobe. Hepatobiliary: Gallbladder and liver grossly normal appearance Pancreas: Normal appearance Spleen: Slightly prominent, 13.7 x 5.3 x 12.1 cm (volume = 460 cm^3). No focal abnormality. Adrenals/Urinary Tract: Adrenal glands and RIGHT kidney normal appearance. Patchy LEFT nephrogram with minimal perinephric edema consistent with pyelonephritis. No renal calculus or hydronephrosis. No ureteral calcification or dilatation. Bladder unremarkable. Stomach/Bowel: Normal appendix. Within limitations of respiratory motion no gross abnormalities of the stomach or bowel loops identified. Vascular/Lymphatic: Vascular structures grossly patent. No adenopathy. Reproductive: Significantly enlarged postpartum uterus. Other: No free air or free fluid. Tiny umbilical hernia containing fat. Musculoskeletal: Osseous structures unremarkable. IMPRESSION: Patchy LEFT nephrogram with minimal perinephric edema consistent with pyelonephritis; recommend correlation with urinalysis. Significantly enlarged postpartum uterus. Tiny umbilical hernia containing fat. Mild infiltrate versus atelectasis in posterior RIGHT lower lobe. Minimal splenic enlargement. Remainder of exam unremarkable. Electronically Signed   By: Lavonia Dana M.D.   On: 05/23/2019 19:14   DG Chest Portable 1 View  Result Date: 05/23/2019 CLINICAL DATA:  Postpartum, back pain EXAM: PORTABLE CHEST 1 VIEW COMPARISON:  None. FINDINGS: There is minimal right basilar hazy airspace disease medially likely reflecting atelectasis versus prominent normal vasculature. There is no focal consolidation. There is no pleural effusion or pneumothorax. The heart and mediastinal contours are unremarkable. There is no acute osseous abnormality. IMPRESSION: No acute cardiopulmonary disease. Minimal right basilar airspace disease likely reflecting  atelectasis versus prominent vasculature. Electronically Signed   By: Kathreen Devoid   On: 05/23/2019 16:01    Procedures Procedures (including critical care time)  Medications Ordered in ED Medications  enoxaparin (LOVENOX) injection 40 mg (40 mg Subcutaneous Given 05/23/19 2243)  acetaminophen (TYLENOL) tablet 650 mg (has no administration in time range)    Or  acetaminophen (TYLENOL) suppository 650 mg (has no administration in time range)  oxyCODONE (Oxy IR/ROXICODONE) immediate release tablet 5 mg (has no administration in time range)  polyethylene glycol (MIRALAX / GLYCOLAX) packet 17 g (has no administration in time range)  ondansetron (ZOFRAN) tablet 4 mg (has no administration in time range)    Or  ondansetron (ZOFRAN) injection 4 mg (has no administration in time range)  0.9 %  sodium chloride infusion ( Intravenous Stopped 05/24/19 0041)  acetaminophen (TYLENOL) tablet 650 mg (650 mg Oral Given 05/23/19 1352)  sodium chloride 0.9 % bolus 1,000 mL (0 mLs Intravenous Stopped 05/23/19 1652)  morphine 4 MG/ML injection 4 mg (4 mg Intravenous Given 05/23/19 1457)  ondansetron (ZOFRAN) injection 4 mg (4 mg Intravenous Given 05/23/19 1457)  potassium chloride 10 mEq in 100 mL IVPB (0 mEq Intravenous Stopped 05/23/19 1652)  potassium chloride SA (KLOR-CON) CR tablet 40 mEq (40 mEq Oral Given 05/23/19 1510)  magnesium sulfate IVPB 1 g 100 mL (0 g Intravenous Stopped 05/23/19 1845)  cefTRIAXone (ROCEPHIN) 1 g in sodium chloride 0.9 % 100 mL IVPB (0 g Intravenous Stopped 05/23/19 1845)  morphine 4 MG/ML injection 4 mg (4 mg Intravenous Given 05/23/19 1853)  iohexol (OMNIPAQUE) 350 MG/ML injection 75 mL (75 mLs Intravenous Contrast Given 05/23/19 1848)  ketorolac (TORADOL) 30 MG/ML injection 30 mg (30 mg Intravenous Given 05/23/19 1920)  sodium chloride 0.9 % bolus 500 mL (0 mLs Intravenous Stopped 05/23/19 2027)  acetaminophen (TYLENOL) tablet 650 mg (650 mg Oral Given 05/23/19 1920)    azithromycin (ZITHROMAX) 500 mg in sodium chloride 0.9 %  250 mL IVPB (0 mg Intravenous Stopped 05/23/19 2155)  sodium chloride 0.9 % bolus 1,000 mL (0 mLs Intravenous Stopped 05/24/19 0418)    ED Course  I have reviewed the triage vital signs and the nursing notes.  Pertinent labs & imaging results that were available during my care of the patient were reviewed by me and considered in my medical decision making (see chart for details).    MDM Rules/Calculators/A&P                      28 year old female who is 4 days postpartum from a induced vaginal delivery who presents for evaluation of fevers, pain to the lateral aspect of her left chest.  Recently diagnosed with Covid.  On initial arrival, she is tachycardic, febrile.  Vitals otherwise stable.  On exam, she has pain to the left lateral chest wall.  No deformity or crepitus noted.  No overlying rash.  Bilateral breasts are without any warmth, erythema, edema.  Abdomen is benign.  She does state that this pain is pleuritic.  Given that she is postpartum, and Covid positive, concern for possible PE that would be contributing to her symptoms.  Plan for labs, chest x-ray, EKG.  D-dimer is elevated at 5.48.  Lactic is elevated 2.3.  CBC shows leukocytosis of 10.9.  Hemoglobin is 10.8.  CMP shows potassium of 2.8.  BUN is 8, 1.16.  We will plan to give potassium replacement.  Patient signed out to Delia Heady, PA-C with labs and CTA pending.   Portions of this note were generated with [employer]. Dictation errors may occur despite best attempts at proofreading.   Final Clinical Impression(s) / ED Diagnoses Final diagnoses:  Sepsis without acute organ dysfunction, due to unspecified organism [facility])  Community acquired pneumonia of right lower lobe of lung  Pyelonephritis    Rx / DC Orders ED Discharge Orders    None       Desma Mcgregor 05/24/19 0741    Drenda Freeze, MD 05/24/19 [phone]

## 2019-05-23 NOTE — H&P (Addendum)
[facility] Admission History and Physical Service Pager: [phone]  Patient name: Katrina Robles Medical record number: [id number] Date of birth: 1990/10/17 Age: 28 y.o. Gender: female  Primary Care Provider: Patient, No Pcp Per Consultants: None Code Status: Full Preferred Emergency Contact: Mom Valeria Batman [phone]  Chief Complaint: pain in Left side and fever  Assessment and Plan: Katrina Robles is a 28 y.o. female presenting with fever and left side back pain.  PMH is significant for 4 days post partum with second degree perineal laceration.  Sepsis 2/2 Pyelonephritis Patient reports having progressively worsening left sided back pain that radiated to left flank.  Started two days ago. She is 4 days s/p vaginal delivery of her first child, no record of fever or concerning symptoms for chorioamnionitis during or after delivery. P. She has mild burning when voiding that she attributed to her laceration from recent delivery. On arrival to the ED she had a Fever 103.3*F, was Tachycardic with HR 102-133, normotensive, Tachypneic to 20s and 30s, satting 96% and above on RA. Lactic Acid elevated to 2.3, improved to 1.0 after 1.5L IV NS. CT abdomen shows left pyelonephritis. Urinalysis positive for large amount of leukocytes, nitrites and ketones. Labs significant for mild leukocytosis (WBC [id number]) with neutrophilic shift. Procalcitonin elevated at 15.87 suggestive of bacterial etiology of sepsis. In the ED was given 1g CTX for Pyelonephritis, Tylenol and Ketorolac for pain/fevers, was also treated for Azithromycin for possible Pneumonia (See next problem). On exam no abdominal pain, has normal bowel sounds, positive Left CVA tenderness. -Admit to Med Surg, Attending Dr. McDiarmid -Continue Ceftriaxone 1gm q24h (12/23-) -blood cultures pending -urine culture pending -maintenance IV Fluids N/S at 140cc/hr -CBC in am -Monitor fever  curve -Tylenol prn -f/u AM CMP, CBC -Vitals per routine -Tylenol PRN -Isolation precautions d/t COVID+ status  COVID 19  ?PNEUMONIA Patient was found to be COVID + about 1 week ago (believes this came from a sick contact at work). Other than mild cough and nasal congestion has been asymptomatic. In the ED patient reports mild discomfort on inspiration and minimally productive cough with some mucus. Denies any SOB or chest pain. Tachypneic with RR 21-35, satting 97-100% on room air, breath sounds are distant d/t body habitus but mild crackles can be appreciated in RLL. Labs significant for WBC 10.9, Procalcitonin elevated at 15.87. Initial D-Dimer elevated at 5.5. CTA negative for PE. Showed RLL airspace disease compatible with pneumonia, however imaging is more compatible with atelactasis. CXR was unimpressive. Due to finding of Pneumonia patient was treated with Azithromycin x1. Patient is able to talk in complete sentences without increase work of breathing. Believe that patient's pneumonia is most likely viral secondary to COVID and not bacterial. -COVID labs (D-dimer, ferritin, CRP) pending -Contact and Airborne precautions -Lovenox -Monitor resp status -Keep oxygen sats >92 -Will not continue Azithromycin due to patient's COVID + status -Consider CCM consult if worsening oxygen requirement  Hypokalemia Potassium 2.8 on admission, Magnesium decreased to 1.3. Not taking any medications known to decrease potassium. She received KCL 7meq IV x1 and 44meq PO in the ED. -CMP in am -Replete as appropriate  Hypomagnesemia Mag 1.3.  Most likely due to decrease appetite. -Mag 1gm IV given in the ED -CMP in am -Replete as appropriate  FEN/GI: N/S at 140cc.hr  Prophylaxis: Lovenox s/c  Disposition: Admit Med Surg, Attending Dr. McDiarmid  History of Present Illness:  Katrina Robles is a 28 y.o. female presenting with left side  back pain that started 2 days ago that radiated to  left flank.  She reports pain was stabbing in nature and was not relieved with Ibuprofen.  She has had no urinary symptoms but does report having some burning that she attributed to a vaginal tear/stitches post partum. Denies any vaginal discharge but still has small amount of post partum bleeding.  She endorses a decrease in appetite over the past couple of days.  Denies any chest pain, SOB, or leg swelling but endorses a mild productive cough. She reports being in contact with sick coworker one week ago. Denies any diarrhea but does has some constipation.  She is not breast feeding  In the ED she was febrile, 103.4, tachycardic, tachypneic with O2 saturation 97-100% on room air and normotensive.  Labs significant for LA 2.3, Mag 1.3, mild leukocytosis [id number] with neurophilic shift. CTA negative for PE, concerning for RLL airspace disease consistent with pneumonia. Chest xray negative for cardiopulmonary disease.  CT abdomen suggestive of Left pyelonephritis.  Urinalysis positive for large amount of leukocytes, nitrates and ketones.  She was given 1.5L N/S, Zithromax 500 gm, Ceftriaxone 1gm, Mag 1gm, KCL 31mEq. She was given Morphine 8 mg total, Toradol 30 mg IV and Zofran 4 mg IV.    Review Of Systems: Per HPI with the following additions:  ROS  Patient Active Problem List   Diagnosis Date Noted  . Normal postpartum course 05/20/2019  . Obesity affecting pregnancy in third trimester 05/18/2019  . Obesity in pregnancy, antepartum 05/18/2019  . COVID-19 affecting pregnancy in third trimester 05/18/2019  . 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 IRREGULAR MENSES 07/31/2008    Past Medical History: Past Medical History:  Diagnosis Date  . COVID-19   . Medical history non-contributory     Past Surgical History: Past Surgical History:  Procedure Laterality Date  . NO PAST SURGERIES      Social History: used to smoke 4-5 cigarettes daily, smoked for 2 years Social History   Tobacco Use  .  Smoking status: Never Smoker  . Smokeless tobacco: Never Used  Substance Use Topics  . Alcohol use: No  . Drug use: No   Additional social history:   Please also refer to relevant sections of EMR.  Family History: Family History  Problem Relation Age of Onset  . Polycystic ovary syndrome Mother     Allergies and Medications: No Known Allergies - verified with patient  No current facility-administered medications on file prior to encounter.   Current Outpatient Medications on File Prior to Encounter  Medication Sig Dispense Refill  . ibuprofen (ADVIL) 600 MG tablet Take 1 tablet (600 mg total) by mouth every 6 (six) hours. 30 tablet 0    Objective: BP 111/87 (BP Location: Right Arm)   Pulse (!) 138   Temp (!) 103.4 F (39.7 C) (Oral)   Resp (!) 32   Ht 5\' 5"  (1.651 m)   Wt 108.9 kg   SpO2 95%   Breastfeeding Yes Comment: shielded  BMI 39.94 kg/m  Exam: General: 28 y.o pleasant lady in no acute distress ENTM: mucus membranes moist Cardiovascular: tachycardic, HR 130, no murmurs appreciated Respiratory: CTAB, no wheezes or crackles appreciated, no IWOB Gastrointestinal: Left CVA tenderness, abd soft, non tender, non distended Extremities: with trace nonpitting edema to bilateral lower extremities, no erythema appreciated  Neuro: patient is alert and cooperative, no focal deficits appreciated  Labs and Imaging: CBC BMET  Recent Labs  Lab 05/23/19 1342  WBC 10.9*  HGB  10.8*  HCT 32.7*  PLT 174   Recent Labs  Lab 05/23/19 1342  NA 138  K 2.8*  CL 106  CO2 18*  BUN 8  CREATININE 1.16*  GLUCOSE 143*  CALCIUM 8.2*      CT Angio Chest PE W and/or Wo Contrast  Result Date: 05/23/2019 CLINICAL DATA:  Shortness of breath. Four days postpartum. COVID positive. EXAM: CT ANGIOGRAPHY CHEST WITH CONTRAST TECHNIQUE: Multidetector CT imaging of the chest was performed using the standard protocol during bolus administration of intravenous contrast. Multiplanar CT  image reconstructions and MIPs were obtained to evaluate the vascular anatomy. CONTRAST:  34mL OMNIPAQUE IOHEXOL 350 MG/ML SOLN COMPARISON:  One-view chest x-ray 05/23/2019 FINDINGS: Cardiovascular: Heart size is normal. Aortic arch and great vessels are within normal limits. Pulmonary artery opacification is satisfactory. No focal filling defects are present to suggest pulmonary embolus. Pulmonary artery size is normal. Mediastinum/Nodes: No significant mediastinal, hilar, or axillary adenopathy is present. There is heterogeneous asymmetric enlargement of the left lobe of the thyroid. No dominant lesion is present. No follow-up is necessary. Lungs/Pleura: Focal airspace opacification is present the posterior right lower lobe. No other significant consolidation is present. There are no effusions or pneumothorax. Upper lung fields are clear bilaterally. Upper Abdomen: Visualized upper abdomen is within normal limits. Musculoskeletal: Vertebral body heights and alignment are maintained. No focal lytic or blastic lesions are present. Review of the MIP images confirms the above findings. IMPRESSION: 1. No pulmonary embolus. 2. Right lower lobe airspace disease compatible with pneumonia. Electronically Signed   By: San Morelle M.D.   On: 05/23/2019 19:32   CT ABDOMEN PELVIS W CONTRAST  Result Date: 05/23/2019 CLINICAL DATA:  Four days post partum, fever, back pain, heart racing, shortness of breath, question pyelonephritis or abscess; COVID positive EXAM: CT ABDOMEN AND PELVIS WITH CONTRAST TECHNIQUE: Multidetector CT imaging of the abdomen and pelvis was performed using the standard protocol following bolus administration of intravenous contrast. Sagittal and coronal MPR images reconstructed from axial data set. Respiratory motion artifacts degrade exam. CONTRAST:  37mL OMNIPAQUE IOHEXOL 350 MG/ML SOLN IV. No oral contrast administered. COMPARISON:  None FINDINGS: Lower chest: Mild infiltrate versus  atelectasis in posterior RIGHT lower lobe. Hepatobiliary: Gallbladder and liver grossly normal appearance Pancreas: Normal appearance Spleen: Slightly prominent, 13.7 x 5.3 x 12.1 cm (volume = 460 cm^3). No focal abnormality. Adrenals/Urinary Tract: Adrenal glands and RIGHT kidney normal appearance. Patchy LEFT nephrogram with minimal perinephric edema consistent with pyelonephritis. No renal calculus or hydronephrosis. No ureteral calcification or dilatation. Bladder unremarkable. Stomach/Bowel: Normal appendix. Within limitations of respiratory motion no gross abnormalities of the stomach or bowel loops identified. Vascular/Lymphatic: Vascular structures grossly patent. No adenopathy. Reproductive: Significantly enlarged postpartum uterus. Other: No free air or free fluid. Tiny umbilical hernia containing fat. Musculoskeletal: Osseous structures unremarkable. IMPRESSION: Patchy LEFT nephrogram with minimal perinephric edema consistent with pyelonephritis; recommend correlation with urinalysis. Significantly enlarged postpartum uterus. Tiny umbilical hernia containing fat. Mild infiltrate versus atelectasis in posterior RIGHT lower lobe. Minimal splenic enlargement. Remainder of exam unremarkable. Electronically Signed   By: Lavonia Dana M.D.   On: 05/23/2019 19:14   DG Chest Portable 1 View  Result Date: 05/23/2019 CLINICAL DATA:  Postpartum, back pain EXAM: PORTABLE CHEST 1 VIEW COMPARISON:  None. FINDINGS: There is minimal right basilar hazy airspace disease medially likely reflecting atelectasis versus prominent normal vasculature. There is no focal consolidation. There is no pleural effusion or pneumothorax. The heart and mediastinal contours are unremarkable. There  is no acute osseous abnormality. IMPRESSION: No acute cardiopulmonary disease. Minimal right basilar airspace disease likely reflecting atelectasis versus prominent vasculature. Electronically Signed   By: Kathreen Devoid   On: 05/23/2019 16:01     Carollee Leitz, MD 05/23/2019, 8:22 PM PGY-1, [facility] Intern pager: [phone], text pages welcome  FPTS Upper-Level Resident Addendum I have independently interviewed and examined the patient. I have discussed the above with the original author and agree with their documentation. My edits for correction/addition/clarification are in blue. Please see also any attending notes.    Milus Banister, DO PGY-2, [facility] 05/24/2019 2:52 AM  FPTS Service pager: [phone] (text pages welcome through [facility])

## 2019-05-23 NOTE — ED Notes (Signed)
Patient transported to CT 

## 2019-05-23 NOTE — ED Notes (Signed)
Lindsay PA at bedside.  

## 2019-05-23 NOTE — ED Provider Notes (Signed)
Physical Exam  BP 111/87 (BP Location: Right Arm)   Pulse (!) 138   Temp (!) 103.4 F (39.7 C) (Oral)   Resp (!) 32   Ht 5\' 5"  (1.651 m)   Wt 108.9 kg   SpO2 95%   Breastfeeding Yes Comment: shielded  BMI 39.94 kg/m   Physical Exam Vitals and nursing note reviewed.  Constitutional:      General: She is not in acute distress.    Appearance: She is well-developed.  HENT:     Head: Normocephalic and atraumatic.     Nose: Nose normal.  Eyes:     General: No scleral icterus.       Left eye: No discharge.     Conjunctiva/sclera: Conjunctivae normal.  Cardiovascular:     Rate and Rhythm: Regular rhythm. Tachycardia present.     Heart sounds: Normal heart sounds. No murmur. No friction rub. No gallop.   Pulmonary:     Effort: Pulmonary effort is normal. Tachypnea present. No respiratory distress.     Breath sounds: Normal breath sounds.  Abdominal:     General: Bowel sounds are normal. There is no distension.     Palpations: Abdomen is soft.     Tenderness: There is no abdominal tenderness. There is no guarding.  Musculoskeletal:        General: Normal range of motion.     Cervical back: Normal range of motion and neck supple.  Skin:    General: Skin is warm and dry.     Findings: No rash.  Neurological:     Mental Status: She is alert.     Motor: No abnormal muscle tone.     Coordination: Coordination normal.     ED Course/Procedures     .Critical Care Performed by: Delia Heady, PA-C Authorized by: Delia Heady, PA-C   Critical care provider statement:    Critical care time (minutes):  35   Critical care time was exclusive of:  Separately billable procedures and treating other patients   Critical care was necessary to treat or prevent imminent or life-threatening deterioration of the following conditions:  Sepsis, renal failure, circulatory failure and metabolic crisis   Critical care was time spent personally by me on the following activities:  Development of  treatment plan with patient or surrogate, discussions with consultants, evaluation of patient's response to treatment, examination of patient, ordering and performing treatments and interventions, ordering and review of laboratory studies, re-evaluation of patient's condition and review of old charts   I assumed direction of critical care for this patient from another provider in my specialty: no      MDM   Care of patient assumed from PA [place] at 3:23 PM.  Agree with history, physical exam and plan.  See their note for further details.  Briefly, 27 y.o. female with PMH/PSH as below who presents with fever, left sided chest pain since yesterday.  She is 3 days postpartum, had a positive COVID-19 test on 05/18/2019 prior to IOL.  She only had nasal congestion at the time.  Yesterday she started having discomfort on left lower rib area and fever.  Work-up here significant for hypokalemia and hypomagnesemia which was repleted oral and IV.  D-dimer is elevated here.  Tachycardia has improved with IV fluids and pain medication, fever resolved.  Past Medical History:  Diagnosis Date  . COVID-19   . Medical history non-contributory    Past Surgical History:  Procedure Laterality Date  . NO PAST SURGERIES  Current Plan:  Plan is to replete magnesium, obtain chest x-ray and CT of the chest to evaluate for PE due to her risk factors and elevated D-dimer.  Does improve and work-up is reassuring, can discharge home.   MDM/ED Course: Urinalysis positive for nitrite, leukocytes, bacteria and pyuria.  Patient was given 1 g of IV Rocephin.  CT of the chest is negative for PE however CT of the abdomen does show findings consistent with left-sided pyelonephritis. Patient's fever returned as well as tachycardia.  She was given additional dose of Tylenol, IV fluids and azithromycin in addition to the Rocephin to cover for community-acquired pneumonia after discussing with ED pharmacist.  She will need  admission for ongoing management of sepsis due to pyelonephritis and pneumonia. Patient agreeable to plan.   Significant labs/images: Labs Reviewed  COMPREHENSIVE METABOLIC PANEL - Abnormal; Notable for the following components:      Result Value   Potassium 2.8 (*)    CO2 18 (*)    Glucose, Bld 143 (*)    Creatinine, Ser 1.16 (*)    Calcium 8.2 (*)    Total Protein 6.2 (*)    Albumin 2.3 (*)    Alkaline Phosphatase 133 (*)    All other components within normal limits  LACTIC ACID, PLASMA - Abnormal; Notable for the following components:   Lactic Acid, Venous 2.3 (*)    All other components within normal limits  CBC WITH DIFFERENTIAL/PLATELET - Abnormal; Notable for the following components:   WBC 10.9 (*)    RBC 3.84 (*)    Hemoglobin 10.8 (*)    HCT 32.7 (*)    Neutro Abs 9.4 (*)    Lymphs Abs 0.5 (*)    Abs Immature Granulocytes 0.10 (*)    All other components within normal limits  URINALYSIS, ROUTINE W REFLEX MICROSCOPIC - Abnormal; Notable for the following components:   Color, Urine AMBER (*)    APPearance CLOUDY (*)    Hgb urine dipstick MODERATE (*)    Ketones, ur 20 (*)    Protein, ur 100 (*)    Nitrite POSITIVE (*)    Leukocytes,Ua LARGE (*)    WBC, UA >50 (*)    Bacteria, UA MANY (*)    All other components within normal limits  D-DIMER, QUANTITATIVE (NOT AT [facility]) - Abnormal; Notable for the following components:   D-Dimer, Quant 5.48 (*)    All other components within normal limits  MAGNESIUM - Abnormal; Notable for the following components:   Magnesium 1.3 (*)    All other components within normal limits  CULTURE, BLOOD (ROUTINE X 2)  CULTURE, BLOOD (ROUTINE X 2)  URINE CULTURE  LACTIC ACID, PLASMA  PROTIME-INR  TROPONIN I (HIGH SENSITIVITY)  TROPONIN I (HIGH SENSITIVITY)    CT Angio Chest PE W and/or Wo Contrast  Result Date: 05/23/2019 CLINICAL DATA:  Shortness of breath. Four days postpartum. COVID positive. EXAM: CT ANGIOGRAPHY CHEST WITH  CONTRAST TECHNIQUE: Multidetector CT imaging of the chest was performed using the standard protocol during bolus administration of intravenous contrast. Multiplanar CT image reconstructions and MIPs were obtained to evaluate the vascular anatomy. CONTRAST:  75mL OMNIPAQUE IOHEXOL 350 MG/ML SOLN COMPARISON:  One-view chest x-ray 05/23/2019 FINDINGS: Cardiovascular: Heart size is normal. Aortic arch and great vessels are within normal limits. Pulmonary artery opacification is satisfactory. No focal filling defects are present to suggest pulmonary embolus. Pulmonary artery size is normal. Mediastinum/Nodes: No significant mediastinal, hilar, or axillary adenopathy is present. There is  heterogeneous asymmetric enlargement of the left lobe of the thyroid. No dominant lesion is present. No follow-up is necessary. Lungs/Pleura: Focal airspace opacification is present the posterior right lower lobe. No other significant consolidation is present. There are no effusions or pneumothorax. Upper lung fields are clear bilaterally. Upper Abdomen: Visualized upper abdomen is within normal limits. Musculoskeletal: Vertebral body heights and alignment are maintained. No focal lytic or blastic lesions are present. Review of the MIP images confirms the above findings. IMPRESSION: 1. No pulmonary embolus. 2. Right lower lobe airspace disease compatible with pneumonia. Electronically Signed   By: San Morelle M.D.   On: 05/23/2019 19:32   CT ABDOMEN PELVIS W CONTRAST  Result Date: 05/23/2019 CLINICAL DATA:  Four days post partum, fever, back pain, heart racing, shortness of breath, question pyelonephritis or abscess; COVID positive EXAM: CT ABDOMEN AND PELVIS WITH CONTRAST TECHNIQUE: Multidetector CT imaging of the abdomen and pelvis was performed using the standard protocol following bolus administration of intravenous contrast. Sagittal and coronal MPR images reconstructed from axial data set. Respiratory motion artifacts  degrade exam. CONTRAST:  86mL OMNIPAQUE IOHEXOL 350 MG/ML SOLN IV. No oral contrast administered. COMPARISON:  None FINDINGS: Lower chest: Mild infiltrate versus atelectasis in posterior RIGHT lower lobe. Hepatobiliary: Gallbladder and liver grossly normal appearance Pancreas: Normal appearance Spleen: Slightly prominent, 13.7 x 5.3 x 12.1 cm (volume = 460 cm^3). No focal abnormality. Adrenals/Urinary Tract: Adrenal glands and RIGHT kidney normal appearance. Patchy LEFT nephrogram with minimal perinephric edema consistent with pyelonephritis. No renal calculus or hydronephrosis. No ureteral calcification or dilatation. Bladder unremarkable. Stomach/Bowel: Normal appendix. Within limitations of respiratory motion no gross abnormalities of the stomach or bowel loops identified. Vascular/Lymphatic: Vascular structures grossly patent. No adenopathy. Reproductive: Significantly enlarged postpartum uterus. Other: No free air or free fluid. Tiny umbilical hernia containing fat. Musculoskeletal: Osseous structures unremarkable. IMPRESSION: Patchy LEFT nephrogram with minimal perinephric edema consistent with pyelonephritis; recommend correlation with urinalysis. Significantly enlarged postpartum uterus. Tiny umbilical hernia containing fat. Mild infiltrate versus atelectasis in posterior RIGHT lower lobe. Minimal splenic enlargement. Remainder of exam unremarkable. Electronically Signed   By: Lavonia Dana M.D.   On: 05/23/2019 19:14   DG Chest Portable 1 View  Result Date: 05/23/2019 CLINICAL DATA:  Postpartum, back pain EXAM: PORTABLE CHEST 1 VIEW COMPARISON:  None. FINDINGS: There is minimal right basilar hazy airspace disease medially likely reflecting atelectasis versus prominent normal vasculature. There is no focal consolidation. There is no pleural effusion or pneumothorax. The heart and mediastinal contours are unremarkable. There is no acute osseous abnormality. IMPRESSION: No acute cardiopulmonary disease.  Minimal right basilar airspace disease likely reflecting atelectasis versus prominent vasculature. Electronically Signed   By: Kathreen Devoid   On: 05/23/2019 16:01    I personally reviewed and interpreted all labs.  The plan for this patient was discussed with Dr. Colvin Caroli, who voiced agreement and who oversaw evaluation and treatment of this patient.   Portions of this note were generated with [employer]. Dictation errors may occur despite best attempts at proofreading.     Delia Heady, PA-C 05/23/19 1950    Charlesetta Shanks, MD 05/23/19 2100

## 2019-05-23 NOTE — ED Notes (Signed)
PA at bedside.

## 2019-05-23 NOTE — ED Provider Notes (Addendum)
Medical screening examination/treatment/procedure(s) were conducted as a shared visit with non-physician practitioner(s) and myself.  I personally evaluated the patient during the encounter.  EKG Interpretation  Date/Time:  Wednesday May 23 2019 14:43:33 EST Ventricular Rate:  123 PR Interval:    QRS Duration: 80 QT Interval:  312 QTC Calculation: 447 R Axis:   66 Text Interpretation: Sinus tachycardia Borderline T abnormalities, inferior leads No previous ECGs available Confirmed by Wandra Arthurs [phone]) on 05/23/2019 2:54:41 PM Procedures  Angiocath insertion Performed by: Charlesetta Shanks  Consent: Verbal consent obtained. Risks and benefits: risks, benefits and alternatives were discussed Time out: Immediately prior to procedure a "time out" was called to verify the correct patient, procedure, equipment, support staff and site/side marked as required.  Preparation: Patient was prepped and draped in the usual sterile fashion.  Vein Location: right basilic  Yes Ultrasound Guided  Gauge: 20 long  Normal blood return and flush without difficulty Patient tolerance: Patient tolerated the procedure well with no immediate complications.  Patient is alert and appropriate.  She does remain tachycardic.  She is intermittently having fairly severe pain in the left lower chest area.  She reports it will go into episodes of spasms.  Urinalysis is grossly positive for UTI.  High suspicion for pyelonephritis, need to rule out renal abscess as well as PE.  Will add CT abdomen to the already ordered PE study.  Patient does have perinephric stranding and grossly positive urine as well CT identifies infiltrate in the left lower lung.  I have higher suspicion for bacterial etiology for lung finding.  Patient's Covid positive test was done as screening about a week before her delivery.  With fever tachycardia source of infection and tachypnea patient meets sepsis criteria.  Will start sepsis order  set.   Charlesetta Shanks, MD 05/23/19 [id number]    Charlesetta Shanks, MD 05/23/19 2105

## 2019-05-24 ENCOUNTER — Encounter (HOSPITAL_COMMUNITY): Payer: Self-pay | Admitting: Family Medicine

## 2019-05-24 DIAGNOSIS — A419 Sepsis, unspecified organism: Secondary | ICD-10-CM

## 2019-05-24 DIAGNOSIS — U071 COVID-19: Secondary | ICD-10-CM | POA: Insufficient documentation

## 2019-05-24 DIAGNOSIS — E669 Obesity, unspecified: Secondary | ICD-10-CM | POA: Diagnosis present

## 2019-05-24 DIAGNOSIS — N10 Acute tubulo-interstitial nephritis: Secondary | ICD-10-CM

## 2019-05-24 HISTORY — DX: Obesity, unspecified: E66.9

## 2019-05-24 LAB — COMPREHENSIVE METABOLIC PANEL
ALT: 21 U/L (ref 0–44)
AST: 21 U/L (ref 15–41)
Albumin: 1.8 g/dL — ABNORMAL LOW (ref 3.5–5.0)
Alkaline Phosphatase: 99 U/L (ref 38–126)
Anion gap: 10 (ref 5–15)
BUN: 8 mg/dL (ref 6–20)
CO2: 21 mmol/L — ABNORMAL LOW (ref 22–32)
Calcium: 7.5 mg/dL — ABNORMAL LOW (ref 8.9–10.3)
Chloride: 110 mmol/L (ref 98–111)
Creatinine, Ser: 0.76 mg/dL (ref 0.44–1.00)
GFR calc Af Amer: >60 mL/min (ref >60)
GFR calc non Af Amer: >60 mL/min (ref >60)
Glucose, Bld: 105 mg/dL — ABNORMAL HIGH (ref 70–99)
Potassium: 3.7 mmol/L (ref 3.5–5.1)
Sodium: 141 mmol/L (ref 135–145)
Total Bilirubin: 0.3 mg/dL (ref 0.3–1.2)
Total Protein: 4.8 g/dL — ABNORMAL LOW (ref 6.5–8.1)

## 2019-05-24 LAB — CBC WITH DIFFERENTIAL/PLATELET
Abs Immature Granulocytes: 0.09 10*3/uL — ABNORMAL HIGH (ref 0.00–0.07)
Basophils Absolute: 0 10*3/uL (ref 0.0–0.1)
Basophils Relative: 0 %
Eosinophils Absolute: 0 10*3/uL (ref 0.0–0.5)
Eosinophils Relative: 0 %
HCT: 27.2 % — ABNORMAL LOW (ref 36.0–46.0)
Hemoglobin: 8.4 g/dL — ABNORMAL LOW (ref 12.0–15.0)
Immature Granulocytes: 1 %
Lymphocytes Relative: 9 %
Lymphs Abs: 0.8 10*3/uL (ref 0.7–4.0)
MCH: 27.7 pg (ref 26.0–34.0)
MCHC: 30.9 g/dL (ref 30.0–36.0)
MCV: 89.8 fL (ref 80.0–100.0)
Monocytes Absolute: 0.9 10*3/uL (ref 0.1–1.0)
Monocytes Relative: 10 %
Neutro Abs: 7.1 10*3/uL (ref 1.7–7.7)
Neutrophils Relative %: 80 %
Platelets: DECREASED 10*3/uL (ref 150–400)
RBC: 3.03 MIL/uL — ABNORMAL LOW (ref 3.87–5.11)
RDW: 14.5 % (ref 11.5–15.5)
WBC: 8.9 10*3/uL (ref 4.0–10.5)
nRBC: 0 % (ref 0.0–0.2)

## 2019-05-24 LAB — HEMOGLOBIN A1C
Hgb A1c MFr Bld: 4.9 % (ref 4.8–5.6)
Mean Plasma Glucose: 93.93 mg/dL

## 2019-05-24 LAB — C-REACTIVE PROTEIN: CRP: 28.9 mg/dL — ABNORMAL HIGH (ref <1.0)

## 2019-05-24 LAB — FERRITIN: Ferritin: 59 ng/mL (ref 11–307)

## 2019-05-24 LAB — MAGNESIUM: Magnesium: 1.8 mg/dL (ref 1.7–2.4)

## 2019-05-24 LAB — D-DIMER, QUANTITATIVE: D-Dimer, Quant: 3.25 ug/mL-FEU — ABNORMAL HIGH (ref 0.00–0.50)

## 2019-05-24 MED ORDER — SODIUM CHLORIDE 0.9 % IV SOLN
1.0000 g | INTRAVENOUS | Status: DC
  Administered 2019-05-24 – 2019-05-25 (×2): 1 g via INTRAVENOUS
  Filled 2019-05-24 (×2): qty 10

## 2019-05-24 MED ORDER — SODIUM CHLORIDE 0.9 % IV BOLUS
1000.0000 mL | Freq: Once | INTRAVENOUS | Status: AC
  Administered 2019-05-24: 1000 mL via INTRAVENOUS

## 2019-05-24 NOTE — Progress Notes (Addendum)
Family Medicine Teaching Service Daily Progress Note Intern Pager: [phone]  Patient name: Katrina Robles Medical record number: [id number] Date of birth: 03-27-1991 Age: 28 y.o. Gender: female  Primary Care Provider: Patient, No Pcp Per Consultants: None Code Status: Full Code   Pt Overview and Major Events to Date:  05/23/19: admitted for follow nephritis  Assessment and Plan: FABLE MURE is a 28 y.o. female presenting with left-sided back pain and found to be Covid 19+, admitted with pyelonephritis.  Past medical history significant for being recently postpartum and obesity  Sepsis secondary to left pyelonephritis Patient did to use to report left-sided flank pain is 4/10 in and has improved from time of admission.  Patient has been noted to be afebrile since 21:00.  Cultures negative at 24 hours, urine culture still pending. -Continue ceftriaxone  -Continue IV fluids -Follow-up urine cultures -Oxycodone 5 as needed  COVID-19 positive Patient has been stable on room air overnight with saturations ranging from 95 to 100% on room air.  His decreased breath sounds on left base otherwise aerating well no increased work of breathing. -Continue contact and droplet precautions -Stable on room air but will give supplemental oxygen if required -Maintain continuous pulse ox -Will not start remdesivir, chest CT findings more consistent with atelectasis   Hypokalemia, resolved Patient was hypokalemic on admission, potassium has improved to 3.7 this morning.  She received total of 50 mEq of potassium upon admission. -We will continue to monitor  FEN/GI:  PPx: Lovenox  Disposition: Discharge pending infection control for follow  Subjective:  Patient reports that her left-sided pain has improved and now feels more "sore".  Patient reports his pain is 4/10 in severity.  Patient denies any shortness of breath or difficulty breathing.  Objective: Temp:  [98.3 F (36.8  C)-103.4 F (39.7 C)] 98.3 F (36.8 C) (12/24 1436) Pulse Rate:  [75-138] 100 (12/24 1415) Resp:  [13-46] 30 (12/24 1415) BP: (88-136)/(40-92) 115/64 (12/24 1415) SpO2:  [93 %-100 %] 98 % (12/24 1415) Weight:  [108.9 kg] 108.9 kg (12/23 1919)  Physical Exam: General: Obese female lying in bed in no acute distress, pleasantly conversational Cardiovascular: Regular rate and rhythm at the time of my exam, 08:40, normal S1 and S2, no murmurs or gallops Respiratory: Decreased breath sounds at left lobe base, normal aeration otherwise, no increased work of breathing, patient stable on room air Abdomen: Soft, nontender, uterine fundus appears to be at the level of the umbilicus, minimal bowel sounds appreciated; and an Ace on left flank just inferior to rib Extremities: No pitting edema in bilateral lower extremities   Laboratory: Recent Labs  Lab 05/20/19 0517 05/23/19 1342 05/24/19 0535  WBC 8.1 10.9* 8.9  HGB 9.5* 10.8* 8.4*  HCT 30.8* 32.7* 27.2*  PLT 161 174 PLATELET CLUMPS NOTED ON SMEAR, COUNT APPEARS DECREASED   Recent Labs  Lab 05/18/19 0056 05/23/19 1342 05/24/19 0515  NA 136 138 141  K 4.2 2.8* 3.7  CL 108 106 110  CO2 16* 18* 21*  BUN 8 8 8   CREATININE 0.59 1.16* 0.76  CALCIUM 8.7* 8.2* 7.5*  PROT 5.7* 6.2* 4.8*  BILITOT 0.4 0.8 0.3  ALKPHOS 149* 133* 99  ALT 19 27 21   AST 16 26 21   GLUCOSE 84 143* 105*    Imaging/Diagnostic Tests: CT Angio Chest PE W and/or Wo Contrast  Result Date: 05/23/2019 CLINICAL DATA:  Shortness of breath. Four days postpartum. COVID positive. EXAM: CT ANGIOGRAPHY CHEST WITH CONTRAST TECHNIQUE: Multidetector CT  imaging of the chest was performed using the standard protocol during bolus administration of intravenous contrast. Multiplanar CT image reconstructions and MIPs were obtained to evaluate the vascular anatomy. CONTRAST:  32mL OMNIPAQUE IOHEXOL 350 MG/ML SOLN COMPARISON:  One-view chest x-ray 05/23/2019 FINDINGS: Cardiovascular:  Heart size is normal. Aortic arch and great vessels are within normal limits. Pulmonary artery opacification is satisfactory. No focal filling defects are present to suggest pulmonary embolus. Pulmonary artery size is normal. Mediastinum/Nodes: No significant mediastinal, hilar, or axillary adenopathy is present. There is heterogeneous asymmetric enlargement of the left lobe of the thyroid. No dominant lesion is present. No follow-up is necessary. Lungs/Pleura: Focal airspace opacification is present the posterior right lower lobe. No other significant consolidation is present. There are no effusions or pneumothorax. Upper lung fields are clear bilaterally. Upper Abdomen: Visualized upper abdomen is within normal limits. Musculoskeletal: Vertebral body heights and alignment are maintained. No focal lytic or blastic lesions are present. Review of the MIP images confirms the above findings. IMPRESSION: 1. No pulmonary embolus. 2. Right lower lobe airspace disease compatible with pneumonia. Electronically Signed   By: San Morelle M.D.   On: 05/23/2019 19:32   CT ABDOMEN PELVIS W CONTRAST  Result Date: 05/23/2019 CLINICAL DATA:  Four days post partum, fever, back pain, heart racing, shortness of breath, question pyelonephritis or abscess; COVID positive EXAM: CT ABDOMEN AND PELVIS WITH CONTRAST TECHNIQUE: Multidetector CT imaging of the abdomen and pelvis was performed using the standard protocol following bolus administration of intravenous contrast. Sagittal and coronal MPR images reconstructed from axial data set. Respiratory motion artifacts degrade exam. CONTRAST:  51mL OMNIPAQUE IOHEXOL 350 MG/ML SOLN IV. No oral contrast administered. COMPARISON:  None FINDINGS: Lower chest: Mild infiltrate versus atelectasis in posterior RIGHT lower lobe. Hepatobiliary: Gallbladder and liver grossly normal appearance Pancreas: Normal appearance Spleen: Slightly prominent, 13.7 x 5.3 x 12.1 cm (volume = 460 cm^3).  No focal abnormality. Adrenals/Urinary Tract: Adrenal glands and RIGHT kidney normal appearance. Patchy LEFT nephrogram with minimal perinephric edema consistent with pyelonephritis. No renal calculus or hydronephrosis. No ureteral calcification or dilatation. Bladder unremarkable. Stomach/Bowel: Normal appendix. Within limitations of respiratory motion no gross abnormalities of the stomach or bowel loops identified. Vascular/Lymphatic: Vascular structures grossly patent. No adenopathy. Reproductive: Significantly enlarged postpartum uterus. Other: No free air or free fluid. Tiny umbilical hernia containing fat. Musculoskeletal: Osseous structures unremarkable. IMPRESSION: Patchy LEFT nephrogram with minimal perinephric edema consistent with pyelonephritis; recommend correlation with urinalysis. Significantly enlarged postpartum uterus. Tiny umbilical hernia containing fat. Mild infiltrate versus atelectasis in posterior RIGHT lower lobe. Minimal splenic enlargement. Remainder of exam unremarkable. Electronically Signed   By: Lavonia Dana M.D.   On: 05/23/2019 19:14   DG Chest Portable 1 View  Result Date: 05/23/2019 CLINICAL DATA:  Postpartum, back pain EXAM: PORTABLE CHEST 1 VIEW COMPARISON:  None. FINDINGS: There is minimal right basilar hazy airspace disease medially likely reflecting atelectasis versus prominent normal vasculature. There is no focal consolidation. There is no pleural effusion or pneumothorax. The heart and mediastinal contours are unremarkable. There is no acute osseous abnormality. IMPRESSION: No acute cardiopulmonary disease. Minimal right basilar airspace disease likely reflecting atelectasis versus prominent vasculature. Electronically Signed   By: Kathreen Devoid   On: 05/23/2019 16:01     Stark Klein, MD 05/24/2019, 2:55 PM PGY-1, [place] Intern pager: [phone], text pages welcome

## 2019-05-24 NOTE — ED Notes (Signed)
Resident notified of patient BP's. Manual Pressure to be taken and documented and review new orders.

## 2019-05-24 NOTE — Progress Notes (Signed)
FPTS Interim Progress Note  S: Pt denies any abdominal pain and vaginal discharge.  She reports not having sexual intercourse since delivery of baby on Dec 19th.   O: BP 116/79 (BP Location: Left Arm)   Pulse (!) 104   Temp (!) 103.1 F (39.5 C) (Oral)   Resp (!) 25   Ht 5\' 5"  (1.651 m)   Wt 108.9 kg   SpO2 98%   Breastfeeding Yes Comment: shielded  BMI 39.94 kg/m    SVE: cervix friable, no discharge noted, vaginal walls normal Bimanual exam negative for any adnexal mass or tenderness.  Mild tenderness noted at suture site.  A/P: Differentials include Endometritis, Pylenophritis, COVID 19 Low suspicion for endometritis given patient is post partum day 5 and reports no sexual encounters since delivery.  No vaginal discharge or abdominal tenderness.Speculum exam benign.  Given that patient has intermittent fevers relieved with tylenol, tachypnea,tachycardia, positive COVID 19 test (12/16) and given CT abdomen confirmed Left Pyelonephritis, u/a positive for nitrites, leukocytes most likely sepsis is combination of COVID and Pyelonephritis. -Continue current management Ceftriaxone -Endocervical GC/C swab sent -monitor for worsening signs   Carollee Leitz, MD 05/24/2019, 9:16 PM PGY-1, [facility]  Service pager [phone]

## 2019-05-24 NOTE — Discharge Summary (Signed)
[facility] Discharge Summary  Patient name: Katrina Robles Medical record number: [id number] Date of birth: 01-16-1991 Age: 28 y.o. Gender: female Date of Admission: 05/23/2019  Date of Discharge: 05/27/19 Admitting Physician: Blane Ohara McDiarmid, MD  Primary Care Provider: Patient, No Pcp Per Consultants: none  Indication for Hospitalization:  Pyelonephritis  Discharge Diagnoses/Problem List:  Sepsis secondary to Pyelonephritis COVID 19 positive  Disposition: home  Discharge Condition: stable  Discharge Exam:  General: NAD, pleasant Cardiovascular: RRR, no m/r/g, no LE edema Respiratory: CTA BL, normal work of breathing Gastrointestinal: soft, nontender, nondistended MSK: moves 4 extremities equally Derm: no rashes appreciated Neuro: CN II-XII grossly intact Psych: AOx3, appropriate affect  Brief Hospital Course:  This 28 year old female presented to the emergency room on 12/23 with fever and left-sided back pain. She was 4 days postpartum after SVD on 12/19. In the emergency room, she was found to be septic with a lactic acid elevated to 2.3, fever to 103.3, tachypnea. Shr received 2.5L NS. Patient reported that she had been Covid positive for 1 week prior to coming in but was not having respiratory symptoms. Procalcitonin was elevated to 15.87 likely in setting of sepsis. CTA was negative for PE. CXR was unimpressive in the ED. Patient given azithromycin x1 in the ED, which given her findings, this was not contnued. She was started on ceftriaxone for her sepsis and blood and urine cultures were obtained. UA obtained showed signs of infection and given back pain patient was subsequently found to have pyelonephritis. She continue ceftriaxone until urine culture susceptibilities came back on 12/26. At that point, she was started on Keflex with a plan to continue with 10 days of treatment ending on 06/01/2019. Blood cultures had no growth for 4 days.   Patient discharged after being afebrile for greater than 24 hours without medication.  Issues for Follow Up:  Ensure that patient symptoms have improved she has completed Keflex 10-day course of antibiotics.  Significant Procedures:  none  Significant Labs and Imaging:  Recent Labs  Lab 05/25/19 0456 05/26/19 0858 05/27/19 0500  WBC 6.5 4.6 6.0  HGB 9.5* 9.0* 9.5*  HCT 29.6* 28.7* 30.1*  PLT 134* 149* 161   Recent Labs  Lab 05/23/19 1342 05/23/19 1458 05/24/19 0515 05/25/19 0456 05/26/19 0858 05/27/19 0500  NA 138  --  141 139 139 142  K 2.8*  --  3.7 3.6 3.6 3.5  CL 106  --  110 107 107 110  CO2 18*  --  21* 20* 19* 22  GLUCOSE 143*  --  105* 89 103* 88  BUN 8  --  8 10 11 8   CREATININE 1.16*  --  0.76 0.88 0.89 0.87  CALCIUM 8.2*  --  7.5* 8.3* 8.4* 8.5*  MG  --  1.3* 1.8  --   --   --   ALKPHOS 133*  --  99 117 128* 140*  AST 26  --  21 21 19 18   ALT 27  --  21 25 23 21   ALBUMIN 2.3*  --  1.8* 1.9* 2.0* 2.1*   CT Angio Chest PE W and/or Wo Contrast  Result Date: 05/23/2019 CLINICAL DATA:  Shortness of breath. Four days postpartum. COVID positive. EXAM: CT ANGIOGRAPHY CHEST WITH CONTRAST TECHNIQUE: Multidetector CT imaging of the chest was performed using the standard protocol during bolus administration of intravenous contrast. Multiplanar CT image reconstructions and MIPs were obtained to evaluate the vascular anatomy. CONTRAST:  38mL OMNIPAQUE IOHEXOL  350 MG/ML SOLN COMPARISON:  One-view chest x-ray 05/23/2019 FINDINGS: Cardiovascular: Heart size is normal. Aortic arch and great vessels are within normal limits. Pulmonary artery opacification is satisfactory. No focal filling defects are present to suggest pulmonary embolus. Pulmonary artery size is normal. Mediastinum/Nodes: No significant mediastinal, hilar, or axillary adenopathy is present. There is heterogeneous asymmetric enlargement of the left lobe of the thyroid. No dominant lesion is present. No follow-up is  necessary. Lungs/Pleura: Focal airspace opacification is present the posterior right lower lobe. No other significant consolidation is present. There are no effusions or pneumothorax. Upper lung fields are clear bilaterally. Upper Abdomen: Visualized upper abdomen is within normal limits. Musculoskeletal: Vertebral body heights and alignment are maintained. No focal lytic or blastic lesions are present. Review of the MIP images confirms the above findings. IMPRESSION: 1. No pulmonary embolus. 2. Right lower lobe airspace disease compatible with pneumonia. Electronically Signed   By: San Morelle M.D.   On: 05/23/2019 19:32   CT ABDOMEN PELVIS W CONTRAST  Result Date: 05/23/2019 CLINICAL DATA:  Four days post partum, fever, back pain, heart racing, shortness of breath, question pyelonephritis or abscess; COVID positive EXAM: CT ABDOMEN AND PELVIS WITH CONTRAST TECHNIQUE: Multidetector CT imaging of the abdomen and pelvis was performed using the standard protocol following bolus administration of intravenous contrast. Sagittal and coronal MPR images reconstructed from axial data set. Respiratory motion artifacts degrade exam. CONTRAST:  56mL OMNIPAQUE IOHEXOL 350 MG/ML SOLN IV. No oral contrast administered. COMPARISON:  None FINDINGS: Lower chest: Mild infiltrate versus atelectasis in posterior RIGHT lower lobe. Hepatobiliary: Gallbladder and liver grossly normal appearance Pancreas: Normal appearance Spleen: Slightly prominent, 13.7 x 5.3 x 12.1 cm (volume = 460 cm^3). No focal abnormality. Adrenals/Urinary Tract: Adrenal glands and RIGHT kidney normal appearance. Patchy LEFT nephrogram with minimal perinephric edema consistent with pyelonephritis. No renal calculus or hydronephrosis. No ureteral calcification or dilatation. Bladder unremarkable. Stomach/Bowel: Normal appendix. Within limitations of respiratory motion no gross abnormalities of the stomach or bowel loops identified. Vascular/Lymphatic:  Vascular structures grossly patent. No adenopathy. Reproductive: Significantly enlarged postpartum uterus. Other: No free air or free fluid. Tiny umbilical hernia containing fat. Musculoskeletal: Osseous structures unremarkable. IMPRESSION: Patchy LEFT nephrogram with minimal perinephric edema consistent with pyelonephritis; recommend correlation with urinalysis. Significantly enlarged postpartum uterus. Tiny umbilical hernia containing fat. Mild infiltrate versus atelectasis in posterior RIGHT lower lobe. Minimal splenic enlargement. Remainder of exam unremarkable. Electronically Signed   By: Lavonia Dana M.D.   On: 05/23/2019 19:14   DG Chest Portable 1 View  Result Date: 05/23/2019 CLINICAL DATA:  Postpartum, back pain EXAM: PORTABLE CHEST 1 VIEW COMPARISON:  None. FINDINGS: There is minimal right basilar hazy airspace disease medially likely reflecting atelectasis versus prominent normal vasculature. There is no focal consolidation. There is no pleural effusion or pneumothorax. The heart and mediastinal contours are unremarkable. There is no acute osseous abnormality. IMPRESSION: No acute cardiopulmonary disease. Minimal right basilar airspace disease likely reflecting atelectasis versus prominent vasculature. Electronically Signed   By: Kathreen Devoid   On: 05/23/2019 16:01   Results/Tests Pending at Time of Discharge: none  Discharge Medications:  Allergies as of 05/27/2019   No Known Allergies      Medication List     TAKE these medications    cephALEXin 250 MG capsule Commonly known as: KEFLEX Take 1 capsule (250 mg total) by mouth 4 (four) times daily for 6 days.        Discharge Instructions: Please refer to Patient Instructions  section of EMR for full details.  Patient was counseled important signs and symptoms that should prompt return to medical care, changes in medications, dietary instructions, activity restrictions, and follow up appointments.   Follow-Up Appointments:     Cera Rorke, [place], DO 05/27/2019, 11:09 AM PGY-3, [place]

## 2019-05-25 ENCOUNTER — Telehealth (HOSPITAL_COMMUNITY): Payer: Self-pay | Admitting: Lactation Services

## 2019-05-25 ENCOUNTER — Encounter (HOSPITAL_COMMUNITY): Payer: Self-pay | Admitting: Family Medicine

## 2019-05-25 DIAGNOSIS — O98513 Other viral diseases complicating pregnancy, third trimester: Secondary | ICD-10-CM

## 2019-05-25 LAB — CBC WITH DIFFERENTIAL/PLATELET
Abs Immature Granulocytes: 0.08 10*3/uL — ABNORMAL HIGH (ref 0.00–0.07)
Basophils Absolute: 0 10*3/uL (ref 0.0–0.1)
Basophils Relative: 0 %
Eosinophils Absolute: 0 10*3/uL (ref 0.0–0.5)
Eosinophils Relative: 0 %
HCT: 29.6 % — ABNORMAL LOW (ref 36.0–46.0)
Hemoglobin: 9.5 g/dL — ABNORMAL LOW (ref 12.0–15.0)
Immature Granulocytes: 1 %
Lymphocytes Relative: 13 %
Lymphs Abs: 0.9 10*3/uL (ref 0.7–4.0)
MCH: 27.6 pg (ref 26.0–34.0)
MCHC: 32.1 g/dL (ref 30.0–36.0)
MCV: 86 fL (ref 80.0–100.0)
Monocytes Absolute: 0.7 10*3/uL (ref 0.1–1.0)
Monocytes Relative: 11 %
Neutro Abs: 4.8 10*3/uL (ref 1.7–7.7)
Neutrophils Relative %: 75 %
Platelets: 134 10*3/uL — ABNORMAL LOW (ref 150–400)
RBC: 3.44 MIL/uL — ABNORMAL LOW (ref 3.87–5.11)
RDW: 14.2 % (ref 11.5–15.5)
WBC: 6.5 10*3/uL (ref 4.0–10.5)
nRBC: 0 % (ref 0.0–0.2)

## 2019-05-25 LAB — COMPREHENSIVE METABOLIC PANEL
ALT: 25 U/L (ref 0–44)
AST: 21 U/L (ref 15–41)
Albumin: 1.9 g/dL — ABNORMAL LOW (ref 3.5–5.0)
Alkaline Phosphatase: 117 U/L (ref 38–126)
Anion gap: 12 (ref 5–15)
BUN: 10 mg/dL (ref 6–20)
CO2: 20 mmol/L — ABNORMAL LOW (ref 22–32)
Calcium: 8.3 mg/dL — ABNORMAL LOW (ref 8.9–10.3)
Chloride: 107 mmol/L (ref 98–111)
Creatinine, Ser: 0.88 mg/dL (ref 0.44–1.00)
GFR calc Af Amer: >60 mL/min (ref >60)
GFR calc non Af Amer: >60 mL/min (ref >60)
Glucose, Bld: 89 mg/dL (ref 70–99)
Potassium: 3.6 mmol/L (ref 3.5–5.1)
Sodium: 139 mmol/L (ref 135–145)
Total Bilirubin: 0.2 mg/dL — ABNORMAL LOW (ref 0.3–1.2)
Total Protein: 5.3 g/dL — ABNORMAL LOW (ref 6.5–8.1)

## 2019-05-25 LAB — C-REACTIVE PROTEIN: CRP: 25.5 mg/dL — ABNORMAL HIGH (ref <1.0)

## 2019-05-25 LAB — D-DIMER, QUANTITATIVE: D-Dimer, Quant: 4.12 ug/mL-FEU — ABNORMAL HIGH (ref 0.00–0.50)

## 2019-05-25 LAB — FERRITIN: Ferritin: 57 ng/mL (ref 11–307)

## 2019-05-25 MED ORDER — KETOROLAC TROMETHAMINE 60 MG/2ML IM SOLN
60.0000 mg | Freq: Once | INTRAMUSCULAR | Status: AC
  Administered 2019-05-25: 60 mg via INTRAMUSCULAR
  Filled 2019-05-25: qty 2

## 2019-05-25 NOTE — Lactation Note (Signed)
Lactation Consultation Note  Patient Name: Katrina Robles [id number] Date: 05/25/2019   Telephone call into patient's room: I spoke with patient. She says that she has received the ice packs & cabbage leaves & feels that they are helping for the time being. Patient does not feel like she needs any help from the lactation staff at this time.     Matthias Hughs [facility] 05/25/2019, 1:32 PM

## 2019-05-25 NOTE — Progress Notes (Addendum)
Family Medicine Teaching Service Daily Progress Note Intern Pager: [phone]  Patient name: Katrina Robles Medical record number: [id number] Date of birth: May 15, 1991 Age: 28 y.o. Gender: female  Primary Care Provider: Patient, No Pcp Per Consultants: None Code Status: Full Code   Pt Overview and Major Events to Date:  05/23/19: admitted for follow nephritis  Assessment and Plan: Katrina Robles is a 28 y.o. female presenting with left-sided back pain and found to be Covid 19+, admitted with pyelonephritis.  Past medical history significant for being recently postpartum and obesity  Sepsis secondary to left pyelonephritis Likely secondary to pyelonephritis. Unlikely COVID causing continued fevers given  COVID symptoms ~14 days ago and lack of other associated COVID symptoms. Unlikely endometritis given lack of pelvic pain and normal pelvic exam. GC/Cl drawn and pending. Urine culture notable for GNR. Blood culture Neg x 2 days.  - Continue ceftriaxone - Continue IV fluids - Follow-up urine sensitivities, narrow pending results - Oxycodone 5 as needed - Tylenol PRN for fever - Toradol 60mg  IM x 1  - If declines overnight, obtain repeat imaging with CXR and potential ultrasound to look for phlegmon, broaden antibiotics due to concern for resistant bacteria to CTX  COVID-19 positive Remains stable on room air overnight. Lung exam with good air movement throughout lungs. No increased work of breathing, speaking in full sentences.  -Continue contact and droplet precautions -Stable on room air but will give supplemental oxygen if required -Maintain continuous pulse ox - Continue to hold off on Remdesivir/steroids given lack of O2 requirement  Hypokalemia, resolved K 3.6 this AM.  - replete PRN  FEN/GI: Carb modified diet PPx: Lovenox  Disposition: Discharge pending infection control for follow  Subjective:  Patient doing well this morning. Denies any nausea,  vomiting, shortness of breath, abdominal pain, or pelvic pain. Notes voiding, stooling, and eating well. Lochia is mild and improving daily. Continue to be febrile overnight, but otherwise denies any symptoms. NO other acute events overnight.   Objective: Temp:  [98.2 F (36.8 C)-103.1 F (39.5 C)] 99.6 F (37.6 C) (12/25 0906) Pulse Rate:  [69-120] 94 (12/25 0906) Resp:  [13-46] 19 (12/25 0906) BP: (99-136)/(53-115) 127/64 (12/25 0800) SpO2:  [90 %-100 %] 93 % (12/25 0906) Weight:  [id number] kg] 113 kg (12/25 0312)  Physical Exam: General: very pleasant female, well nourished, well developed, in no acute distress with non-toxic appearance, lying comfortably on exam bed CV: regular rate and rhythm without murmurs, rubs, or gallops, no lower extremity edema Lungs: clear to auscultation bilaterally with normal work of breathing on room air Abdomen: soft, non-tender, non-distended,  normoactive bowel sounds Pelvis: nontender to palpation Skin: warm, dry Extremities: warm and well perfused Neuro: Alert and oriented, speech normal  Laboratory: Recent Labs  Lab 05/23/19 1342 05/24/19 0535 05/25/19 0456  WBC 10.9* 8.9 6.5  HGB 10.8* 8.4* 9.5*  HCT 32.7* 27.2* 29.6*  PLT 174 PLATELET CLUMPS NOTED ON SMEAR, COUNT APPEARS DECREASED 134*   Recent Labs  Lab 05/23/19 1342 05/24/19 0515 05/25/19 0456  NA 138 141 139  K 2.8* 3.7 3.6  CL 106 110 107  CO2 18* 21* 20*  BUN 8 8 10   CREATININE 1.16* 0.76 0.88  CALCIUM 8.2* 7.5* 8.3*  PROT 6.2* 4.8* 5.3*  BILITOT 0.8 0.3 0.2*  ALKPHOS 133* 99 117  ALT 27 21 25   AST 26 21 21   GLUCOSE 143* 105* 89    Imaging/Diagnostic Tests: No results found.   Shermika Balthaser P, DO  05/25/2019, 10:20 AM PGY-2, [place] Intern pager: [phone], text pages welcome

## 2019-05-25 NOTE — Telephone Encounter (Signed)
I spoke with Niger, Therapist, sports. This patient does not desire to breastfeed & is engorged. I suggested ice packs 10-15 minutes out of each hour. Ginger, Charge RN is trying to obtain cabbage leaves. I instructed Niger on how to use the cabbage leaves, if they can be obtained.   I gave Niger my cell # if she needs me further. If patient's discomfort warrants, I did discuss the possibility of assisting patient with expressing just enough milk to relieve the pressure (not to drain the breasts).   Elinor Dodge, RN, IBCLC

## 2019-05-26 LAB — CBC WITH DIFFERENTIAL/PLATELET
Abs Immature Granulocytes: 0.2 10*3/uL — ABNORMAL HIGH (ref 0.00–0.07)
Basophils Absolute: 0 10*3/uL (ref 0.0–0.1)
Basophils Relative: 0 %
Eosinophils Absolute: 0 10*3/uL (ref 0.0–0.5)
Eosinophils Relative: 1 %
HCT: 28.7 % — ABNORMAL LOW (ref 36.0–46.0)
Hemoglobin: 9 g/dL — ABNORMAL LOW (ref 12.0–15.0)
Immature Granulocytes: 4 %
Lymphocytes Relative: 21 %
Lymphs Abs: 1 10*3/uL (ref 0.7–4.0)
MCH: 27.3 pg (ref 26.0–34.0)
MCHC: 31.4 g/dL (ref 30.0–36.0)
MCV: 87 fL (ref 80.0–100.0)
Monocytes Absolute: 0.5 10*3/uL (ref 0.1–1.0)
Monocytes Relative: 11 %
Neutro Abs: 2.8 10*3/uL (ref 1.7–7.7)
Neutrophils Relative %: 63 %
Platelets: 149 10*3/uL — ABNORMAL LOW (ref 150–400)
RBC: 3.3 MIL/uL — ABNORMAL LOW (ref 3.87–5.11)
RDW: 14.1 % (ref 11.5–15.5)
WBC: 4.6 10*3/uL (ref 4.0–10.5)
nRBC: 0 % (ref 0.0–0.2)

## 2019-05-26 LAB — COMPREHENSIVE METABOLIC PANEL
ALT: 23 U/L (ref 0–44)
AST: 19 U/L (ref 15–41)
Albumin: 2 g/dL — ABNORMAL LOW (ref 3.5–5.0)
Alkaline Phosphatase: 128 U/L — ABNORMAL HIGH (ref 38–126)
Anion gap: 13 (ref 5–15)
BUN: 11 mg/dL (ref 6–20)
CO2: 19 mmol/L — ABNORMAL LOW (ref 22–32)
Calcium: 8.4 mg/dL — ABNORMAL LOW (ref 8.9–10.3)
Chloride: 107 mmol/L (ref 98–111)
Creatinine, Ser: 0.89 mg/dL (ref 0.44–1.00)
GFR calc Af Amer: >60 mL/min (ref >60)
GFR calc non Af Amer: >60 mL/min (ref >60)
Glucose, Bld: 103 mg/dL — ABNORMAL HIGH (ref 70–99)
Potassium: 3.6 mmol/L (ref 3.5–5.1)
Sodium: 139 mmol/L (ref 135–145)
Total Bilirubin: 0.3 mg/dL (ref 0.3–1.2)
Total Protein: 5.4 g/dL — ABNORMAL LOW (ref 6.5–8.1)

## 2019-05-26 LAB — URINE CULTURE: Culture: >=100000 — AB

## 2019-05-26 LAB — FERRITIN: Ferritin: 54 ng/mL (ref 11–307)

## 2019-05-26 LAB — C-REACTIVE PROTEIN: CRP: 16.1 mg/dL — ABNORMAL HIGH (ref <1.0)

## 2019-05-26 LAB — D-DIMER, QUANTITATIVE: D-Dimer, Quant: 6.49 ug/mL-FEU — ABNORMAL HIGH (ref 0.00–0.50)

## 2019-05-26 MED ORDER — CEPHALEXIN 250 MG PO CAPS
250.0000 mg | ORAL_CAPSULE | Freq: Four times a day (QID) | ORAL | Status: DC
  Administered 2019-05-26 – 2019-05-27 (×5): 250 mg via ORAL
  Filled 2019-05-26 (×6): qty 1

## 2019-05-26 NOTE — Plan of Care (Signed)
  Problem: Education: Goal: Knowledge of General Education information will improve Description: Including pain rating scale, medication(s)/side effects and non-pharmacologic comfort measures Outcome: Progressing   Problem: Health Behavior/Discharge Planning: Goal: Ability to manage health-related needs will improve Outcome: Progressing   Problem: Clinical Measurements: Goal: Ability to maintain clinical measurements within normal limits will improve Outcome: Progressing   Problem: Activity: Goal: Risk for activity intolerance will decrease Outcome: Progressing   Problem: Nutrition: Goal: Adequate nutrition will be maintained Outcome: Progressing   Problem: Safety: Goal: Ability to remain free from injury will improve Outcome: Progressing   Problem: Skin Integrity: Goal: Risk for impaired skin integrity will decrease Outcome: Progressing

## 2019-05-26 NOTE — Progress Notes (Signed)
Family Medicine Teaching Service Daily Progress Note Intern Pager: [phone]  Patient name: Katrina Robles Medical record number: [id number] Date of birth: 03/13/1991 Age: 28 y.o. Gender: female  Primary Care Provider: Patient, No Pcp Per Consultants: None Code Status: Full  Pt Overview and Major Events to Date:  05/23/2019: admitted with Sepsis due to Pyelonephritis  Assessment and Plan: Katrina Robles is a 28 y.o. female presenting with left-sided back pain and flank pain admitted with pyelonephritis, also recently found to be COVID+. PMH significant for being recently postpartum and obesity.  Left pyelonephritis, sepsis has resolved Continues to have fevers (highest 102.8*F ON), respond well to Tylenol. Low suspicion for endometritis given lack of pelvic pain, abdominal pain, and normal pelvic exam. GC/Cl drawn and pending. Urine culture notable for GNR. Blood culture Neg x 3 days.  - Continue IV ceftriaxone q24hrs, 7 days of treatment - Continue IV fluids - Follow-up urine sensitivities, narrow pending results (no sensitivities as of 12/26 AM) - Oxycodone 5 as needed - Tylenol PRN for fever, no NSAIDs d/t COVID+ status - If declines overnight, obtain repeat imaging with CXR and potential ultrasound to look for phlegmon, broaden antibiotics due to concern for resistant bacteria to CTX. Could also consider   COVID-19 positive Remains stable on room air although no saturations were recorded ON. Lung exam with good air movement throughout lungs. No increased work of breathing, speaking in full sentences.  -Continue contact and droplet precautions -Stable on room air but will give supplemental oxygen if required -Maintain continuous pulse ox - Continue to hold off on Remdesivir/steroids given lack of O2 requirement  Hypokalemia, resolved K 3.6 yesterday morning 12/25. No labs have been drawn this AM 12/26.  - replete PRN  FEN/GI: Carb-modified diet PPx:  Lovenox  Disposition: Most likely home once medically stable (goal is 24 hours without fevers on PO meds after sensitivities resolve)  Subjective:  Doing well this morning, no concerns or complaints, feels good  Objective: Temp:  [97.7 F (36.5 C)-103 F (39.4 C)] 98.5 F (36.9 C) (12/25 2308) Pulse Rate:  [87-100] 87 (12/25 1000) Resp:  [17-24] 22 (12/25 1435) BP: (115-127)/(64-79) 119/78 (12/25 1435) SpO2:  [90 %-98 %] 97 % (12/25 1435) Physical Exam: General: NAD, nontoxic appearing, pleasant patient Cardiovascular: RRR, S1S2 appreciated, no murmus Respiratory: CTA bilaterally, satting well on RA, comfortable work of breathing Abdomen: soft, nontender, normal bowel sounds present Extremities: trace edema to bilateral lower extremities, 2+ DP pulses appreciated  Laboratory: Recent Labs  Lab 05/23/19 1342 05/24/19 0535 05/25/19 0456  WBC 10.9* 8.9 6.5  HGB 10.8* 8.4* 9.5*  HCT 32.7* 27.2* 29.6*  PLT 174 PLATELET CLUMPS NOTED ON SMEAR, COUNT APPEARS DECREASED 134*   Recent Labs  Lab 05/23/19 1342 05/24/19 0515 05/25/19 0456  NA 138 141 139  K 2.8* 3.7 3.6  CL 106 110 107  CO2 18* 21* 20*  BUN 8 8 10   CREATININE 1.16* 0.76 0.88  CALCIUM 8.2* 7.5* 8.3*  PROT 6.2* 4.8* 5.3*  BILITOT 0.8 0.3 0.2*  ALKPHOS 133* 99 117  ALT 27 21 25   AST 26 21 21   GLUCOSE 143* 105* 89   COVID LABS: pending 12/26  Imaging/Diagnostic Tests: No new imaging   Daisy Floro, DO 05/26/2019, 3:36 AM PGY-2, [place] Intern pager: [phone], text pages welcome

## 2019-05-26 NOTE — H&P (Signed)
H&P entered as "Progress Note" inadvertently by admitting team.  Please see note written by Dr. Milus Banister and attested by Dr. Sherren Mocha McDiarmid, dated 05/23/19 at 8:21 PM for full History & Physical.

## 2019-05-27 ENCOUNTER — Other Ambulatory Visit: Payer: Self-pay | Admitting: Family Medicine

## 2019-05-27 DIAGNOSIS — E669 Obesity, unspecified: Secondary | ICD-10-CM

## 2019-05-27 LAB — COMPREHENSIVE METABOLIC PANEL
ALT: 21 U/L (ref 0–44)
AST: 18 U/L (ref 15–41)
Albumin: 2.1 g/dL — ABNORMAL LOW (ref 3.5–5.0)
Alkaline Phosphatase: 140 U/L — ABNORMAL HIGH (ref 38–126)
Anion gap: 10 (ref 5–15)
BUN: 8 mg/dL (ref 6–20)
CO2: 22 mmol/L (ref 22–32)
Calcium: 8.5 mg/dL — ABNORMAL LOW (ref 8.9–10.3)
Chloride: 110 mmol/L (ref 98–111)
Creatinine, Ser: 0.87 mg/dL (ref 0.44–1.00)
GFR calc Af Amer: >60 mL/min (ref >60)
GFR calc non Af Amer: >60 mL/min (ref >60)
Glucose, Bld: 88 mg/dL (ref 70–99)
Potassium: 3.5 mmol/L (ref 3.5–5.1)
Sodium: 142 mmol/L (ref 135–145)
Total Bilirubin: 0.3 mg/dL (ref 0.3–1.2)
Total Protein: 5.7 g/dL — ABNORMAL LOW (ref 6.5–8.1)

## 2019-05-27 LAB — CBC WITH DIFFERENTIAL/PLATELET
Abs Immature Granulocytes: 0.27 10*3/uL — ABNORMAL HIGH (ref 0.00–0.07)
Basophils Absolute: 0 10*3/uL (ref 0.0–0.1)
Basophils Relative: 1 %
Eosinophils Absolute: 0.1 10*3/uL (ref 0.0–0.5)
Eosinophils Relative: 1 %
HCT: 30.1 % — ABNORMAL LOW (ref 36.0–46.0)
Hemoglobin: 9.5 g/dL — ABNORMAL LOW (ref 12.0–15.0)
Immature Granulocytes: 5 %
Lymphocytes Relative: 24 %
Lymphs Abs: 1.4 10*3/uL (ref 0.7–4.0)
MCH: 27.1 pg (ref 26.0–34.0)
MCHC: 31.6 g/dL (ref 30.0–36.0)
MCV: 86 fL (ref 80.0–100.0)
Monocytes Absolute: 0.7 10*3/uL (ref 0.1–1.0)
Monocytes Relative: 11 %
Neutro Abs: 3.5 10*3/uL (ref 1.7–7.7)
Neutrophils Relative %: 58 %
Platelets: 161 10*3/uL (ref 150–400)
RBC: 3.5 MIL/uL — ABNORMAL LOW (ref 3.87–5.11)
RDW: 14.1 % (ref 11.5–15.5)
WBC: 6 10*3/uL (ref 4.0–10.5)
nRBC: 0.3 % — ABNORMAL HIGH (ref 0.0–0.2)

## 2019-05-27 LAB — C-REACTIVE PROTEIN: CRP: 9.7 mg/dL — ABNORMAL HIGH (ref <1.0)

## 2019-05-27 LAB — FERRITIN: Ferritin: 59 ng/mL (ref 11–307)

## 2019-05-27 LAB — D-DIMER, QUANTITATIVE: D-Dimer, Quant: 5.99 ug/mL-FEU — ABNORMAL HIGH (ref 0.00–0.50)

## 2019-05-27 MED ORDER — CEPHALEXIN 250 MG PO CAPS: 250.0000 mg | ORAL_CAPSULE | Freq: Four times a day (QID) | ORAL | 0 refills | Status: AC

## 2019-05-27 NOTE — Progress Notes (Signed)
Ardath A Lynch-Ehlers to be D/C'd Home per MD order.  Discussed with the patient and all questions fully answered.  VSS, Skin clean, dry and intact without evidence of skin break down, no evidence of skin tears noted. IV catheter discontinued intact. Site without signs and symptoms of complications. Dressing and pressure applied.  An After Visit Summary was printed and given to the patient. D/c education completed with patient and all questions answered.   Patient instructed to return to ED, call 911, or call MD for any changes in condition.   Patient escorted via [place], and D/C home via private auto.  Jeanella Craze 05/27/2019 6:20 PM

## 2019-05-27 NOTE — Progress Notes (Signed)
Family Medicine Teaching Service Daily Progress Note Intern Pager: [phone]  Patient name: Katrina Robles Medical record number: [id number] Date of birth: Dec 17, 1990 Age: 28 y.o. Gender: female  Primary Care Provider: Patient, No Pcp Per Consultants: None Code Status: Full  Pt Overview and Major Events to Date:  05/23/2019: admitted with Sepsis due to Pyelonephritis 05/27/2019: 24 hours without fever  Assessment and Plan: Katrina Robles is a 28 y.o. female presenting with left-sided back pain and flank pain admitted with pyelonephritis, also recently found to be COVID+. PMH significant for being recently postpartum and obesity.  Last pyelonephritis, sepsis resolved Patient has been without fevers for the past 24 hours without Tylenol. Likely fevers due to pyelonephritis and responding well to Keflex.  Low suspicion for endometritis given lack of pelvic pain, abdominal pain, and normal pelvic exam. Urine culture notable for GNR. Blood culture Neg x 4days.  - Continue keflex (12/26-01/01) for 10 days of treatment - Ceftriaxone (12/23-12/26) - Tylenol PRN for fever, no NSAIDs d/t COVID+ status  COVID-19 positive Remained stable on room air with no reported URI symptoms.  Lung exam with good air exchange.  No increased work of breathing.   -Continue contact and droplet precautions -Stable on room air but will give supplemental oxygen if required -Maintain continuous pulse ox -Continue to hold off on Remdesivir/steroids given lack of O2 requirement  FEN/GI: Carb-modified diet PPx: Lovenox  Disposition: If remains afebrile then okay to DC today  Subjective:  Patient reports that she would like to go home in order to be with her new baby however she also wants to be safe and does not want to come right back to the hospital.  She thinks it would be okay that if she remains afebrile that she can go home this afternoon.  She denies any abdominal pain, back pain.  She  states that overall she is doing very well.  Objective: Temp:  [97.6 F (36.4 C)-99.2 F (37.3 C)] 99.2 F (37.3 C) (12/27 0559) Pulse Rate:  [56-74] 57 (12/27 0559) Resp:  [17-29] 17 (12/27 0559) BP: (108-137)/(75-83) 137/83 (12/27 0559) SpO2:  [94 %-98 %] 97 % (12/27 0559) Physical Exam: General: NAD, pleasant Cardiovascular: RRR, no m/r/g, no LE edema Respiratory: CTA BL, normal work of breathing Gastrointestinal: soft, nontender, nondistended MSK: moves 4 extremities equally Derm: no rashes appreciated Neuro: CN II-XII grossly intact Psych: AOx3, appropriate affect  Laboratory: Recent Labs  Lab 05/25/19 0456 05/26/19 0858 05/27/19 0500  WBC 6.5 4.6 6.0  HGB 9.5* 9.0* 9.5*  HCT 29.6* 28.7* 30.1*  PLT 134* 149* 161   Recent Labs  Lab 05/25/19 0456 05/26/19 0858 05/27/19 0500  NA 139 139 142  K 3.6 3.6 3.5  CL 107 107 110  CO2 20* 19* 22  BUN 10 11 8   CREATININE 0.88 0.89 0.87  CALCIUM 8.3* 8.4* 8.5*  PROT 5.3* 5.4* 5.7*  BILITOT 0.2* 0.3 0.3  ALKPHOS 117 128* 140*  ALT 25 23 21   AST 21 19 18   GLUCOSE 89 103* 88    Imaging/Diagnostic Tests: No new imaging   Valli Randol, [place], DO 05/27/2019, 8:59 AM PGY-3, [place] Intern pager: [phone], text pages welcome

## 2019-05-27 NOTE — Discharge Instructions (Signed)
Thank you for allowing [place] to participate in your care!    You were admitted for a fever that we believe was due to a kidney infection or pyelonephritis. You were also found to be Covid positive. We started you on IV antibiotics until we were able to find the source of your infection and the best oral antibiotic to switch you on based off of your cultures. We then started you on an antibiotic called Keflex. You will need to continue taking Keflex 4 times a day until January 1.   The following are the recommendations for your Covid positive diagnosis: You should quarantine (isolate at home) until the following criteria are met: At least 10 days since symptoms first appeared and At least 24 hours with no fever without fever-reducing medication and Other symptoms of COVID-19 are improving (Loss of taste and smell may persist for weeks or months after recovery and need not delay the end of isolation)  When to seek emergency medical attention Look for emergency warning signs* for COVID-19. If your or someone you know is showing any of these signs, seek emergency medical care immediately:  Trouble breathing Persistent pain or pressure in the chest New confusion Inability to wake or stay awake Bluish lips or face Inability to tolerate fluids  *This list is not all possible symptoms. We discussed additional symptoms   Call 911 or call ahead to your local emergency facility: Notify the operator that you are seeking care for someone who has or may have COVID-19.  If you experience worsening of your admission symptoms, develop shortness of breath, life threatening emergency, suicidal or homicidal thoughts you must seek medical attention immediately by calling 911 or calling your MD immediately  if symptoms less severe.

## 2019-05-28 LAB — CULTURE, BLOOD (ROUTINE X 2)
Culture: NO GROWTH
Culture: NO GROWTH
Special Requests: ADEQUATE

## 2019-05-30 DIAGNOSIS — J189 Pneumonia, unspecified organism: Secondary | ICD-10-CM

## 2019-06-01 ENCOUNTER — Other Ambulatory Visit: Payer: Self-pay | Admitting: Family Medicine

## 2019-06-13 LAB — MOLECULAR ANCILLARY ONLY
Chlamydia: NEGATIVE
Comment: NEGATIVE
Comment: NORMAL
Neisseria Gonorrhea: NEGATIVE

## 2019-11-05 ENCOUNTER — Emergency Department
Admission: EM | Admit: 2019-11-05 | Discharge: 2019-11-05 | Discharge status: Absent without leave | Payer: Medicaid Other

## 2020-11-30 IMAGING — DX DG CHEST 1V PORT
1 series · 1 of 1 positions shown · non-contrast
Comparison: None.

CLINICAL DATA: Postpartum, back pain

EXAM:
PORTABLE CHEST 1 VIEW

[chest]
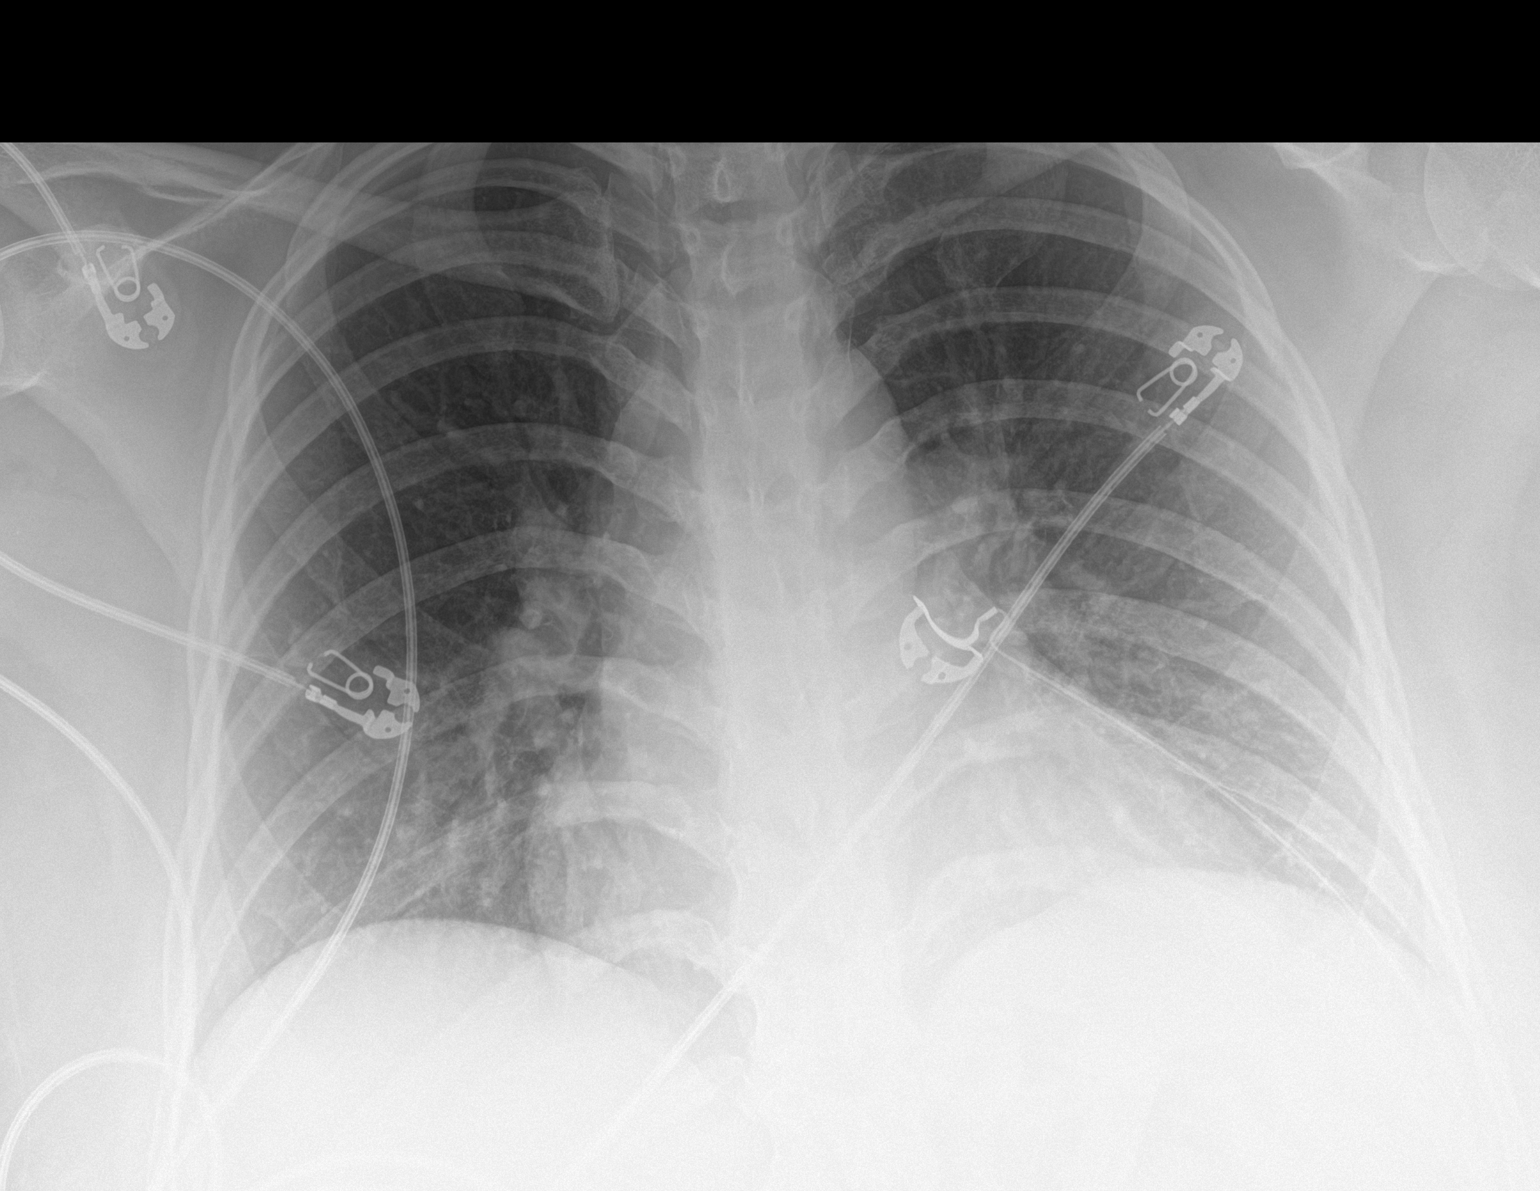

[1 of 1 positions shown; findings below may reference images not displayed]

FINDINGS: There is minimal right basilar hazy airspace disease medially likely
reflecting atelectasis versus prominent normal vasculature. There is
no focal consolidation. There is no pleural effusion or
pneumothorax. The heart and mediastinal contours are unremarkable.

There is no acute osseous abnormality.
IMPRESSION: No acute cardiopulmonary disease. Minimal right basilar airspace
disease likely reflecting atelectasis versus prominent vasculature.

## 2020-11-30 IMAGING — CT CT ABD-PELV W/ CM
2 of 9 series · 17 of 46 positions shown, 19 images · IV contrast (omnipaque)
Comparison: None

CLINICAL DATA: Four days post partum, fever, back pain, heart
racing, shortness of breath, question pyelonephritis or abscess;
COVID positive

EXAM:
CT ABDOMEN AND PELVIS WITH CONTRAST
TECHNIQUE: Multidetector CT imaging of the abdomen and pelvis was performed
using the standard protocol following bolus administration of
intravenous contrast. Sagittal and coronal MPR images reconstructed
from axial data set. Respiratory motion artifacts degrade exam.
CONTRAST:  75mL OMNIPAQUE IOHEXOL 350 MG/ML SOLN IV. No oral
contrast administered.

[Series 8: thins · axial · 0.98mm/px · z∈[+1029,+1497]mm · 14 of 514 slices shown, 16 images]
[im 23/514  soft-tissue]
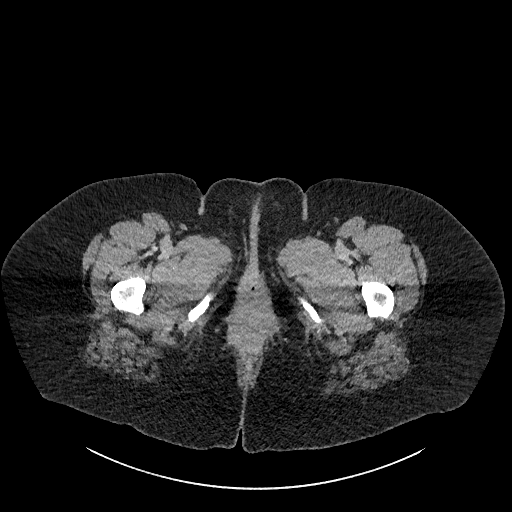
[im 23/514  bone]
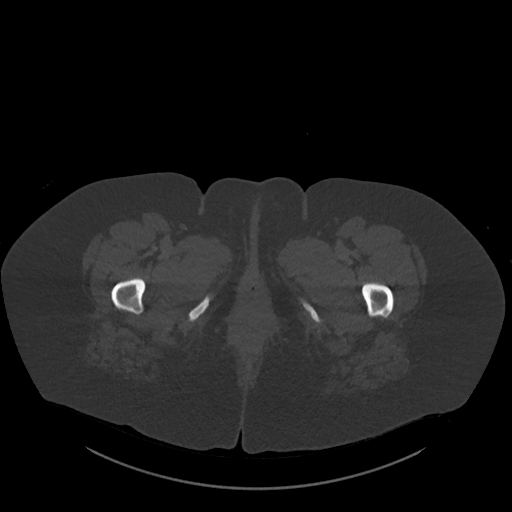
[im 67/514  soft-tissue]
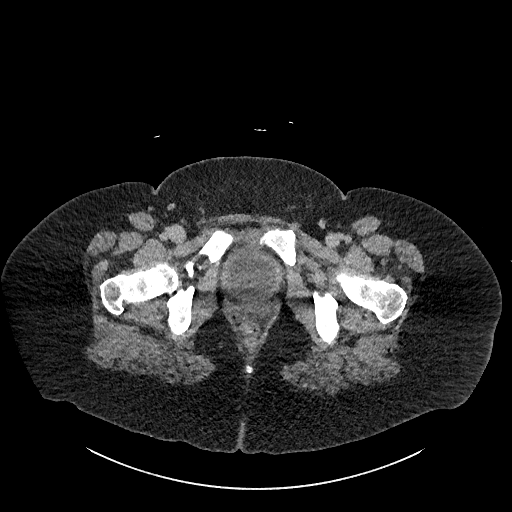
[im 90/514  soft-tissue]
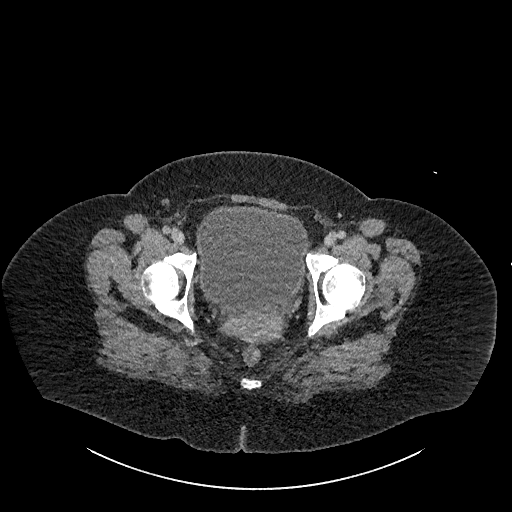
[im 134/514  soft-tissue]
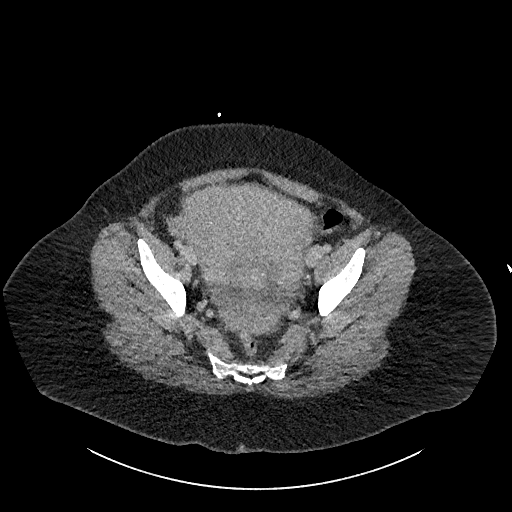
[im 179/514  soft-tissue]
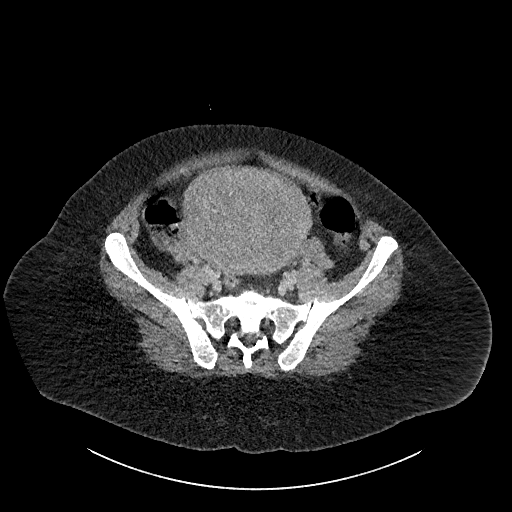
[im 201/514  soft-tissue]
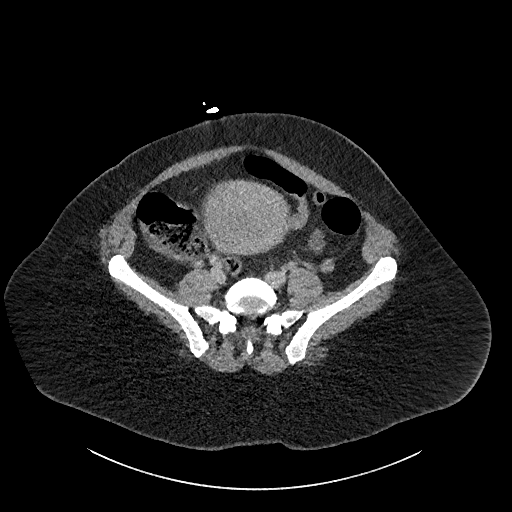
[im 246/514  soft-tissue]
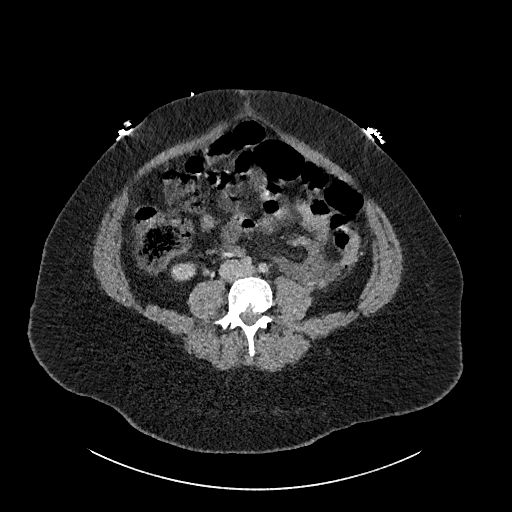
[im 268/514  soft-tissue]
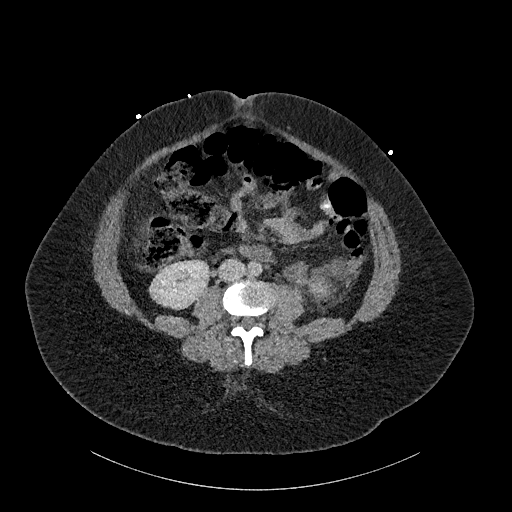
[im 313/514  soft-tissue]
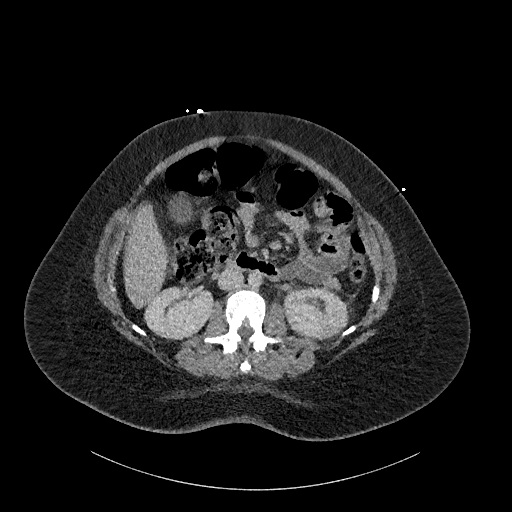
[im 313/514  bone]
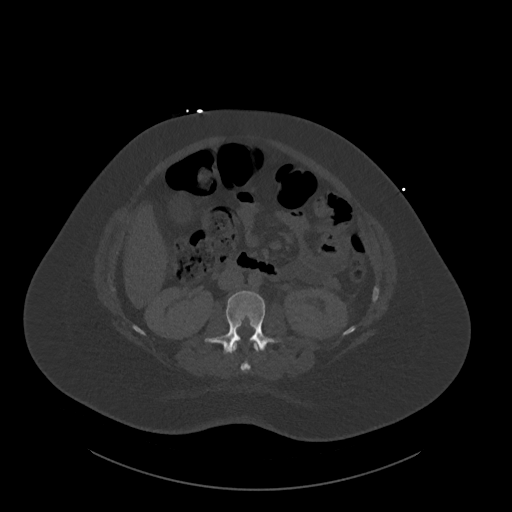
[im 335/514  soft-tissue]
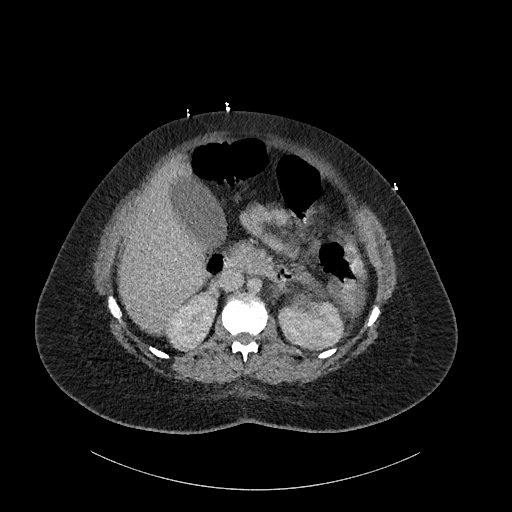
[im 380/514  soft-tissue]
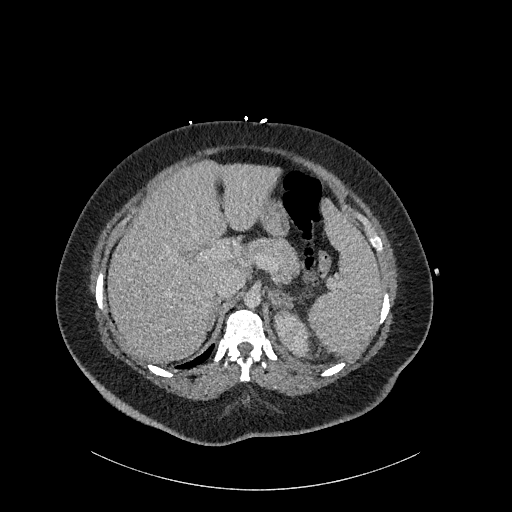
[im 424/514  soft-tissue]
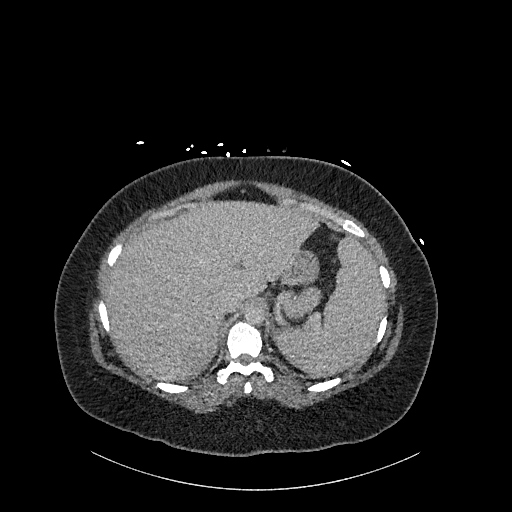
[im 447/514  soft-tissue]
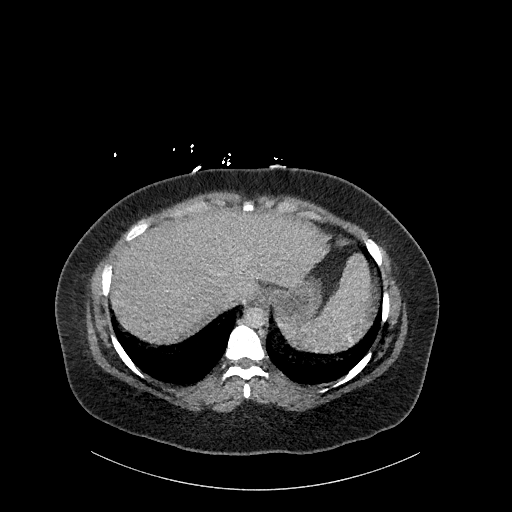
[im 491/514  soft-tissue]
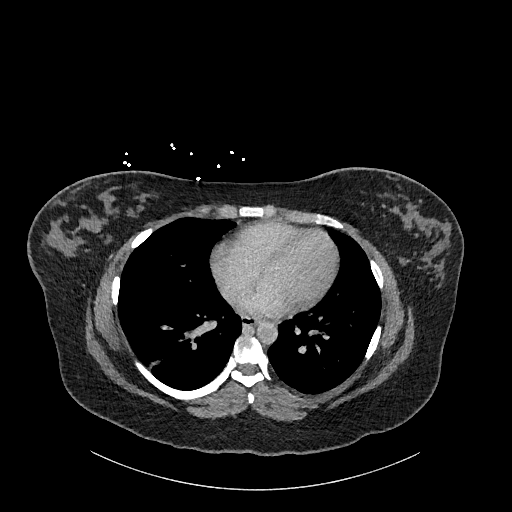

[Series 10: coronals · coronal · 1.00mm/px · 3 of 130 slices shown]
[im 44/130  soft-tissue]
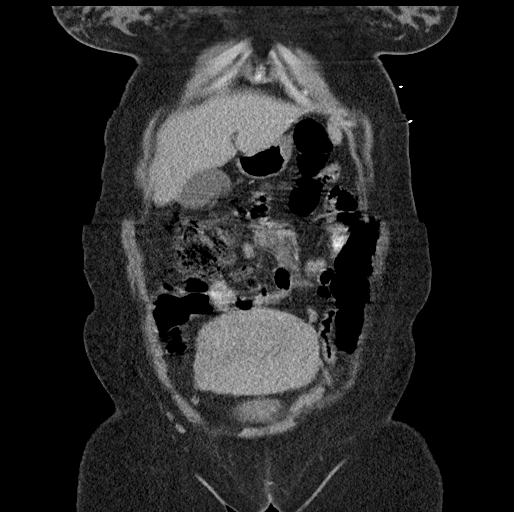
[im 58/130  soft-tissue]
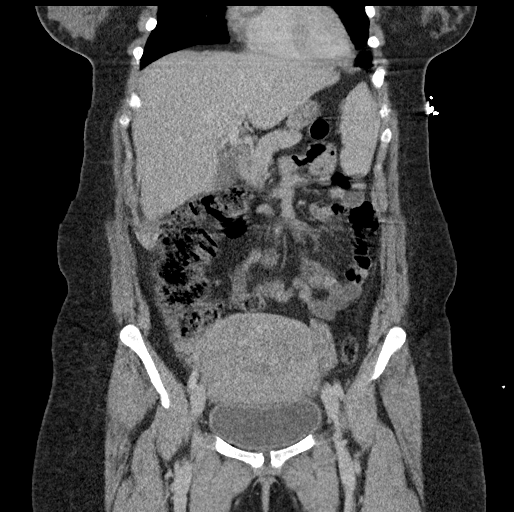
[im 72/130  soft-tissue]
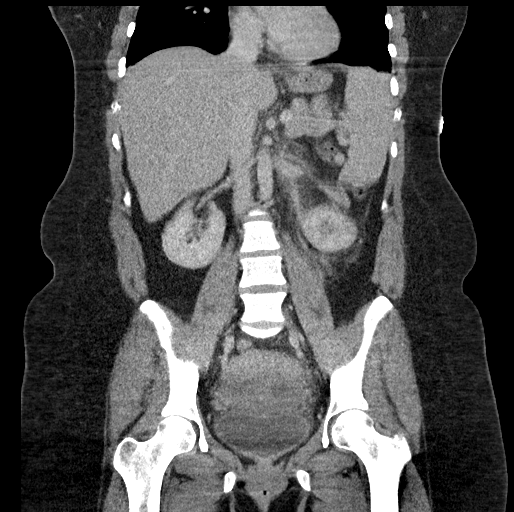

[17 of 46 positions shown; findings below may reference images not displayed]

FINDINGS: Lower chest: Mild infiltrate versus atelectasis in posterior RIGHT
lower lobe.

Hepatobiliary: Gallbladder and liver grossly normal appearance

Pancreas: Normal appearance

Spleen: Slightly prominent, 13.7 x 5.3 x 12.1 cm (volume = 460
cm^3). No focal abnormality.

Adrenals/Urinary Tract: Adrenal glands and RIGHT kidney normal
appearance. Patchy LEFT nephrogram with minimal perinephric edema
consistent with pyelonephritis. No renal calculus or hydronephrosis.
No ureteral calcification or dilatation. Bladder unremarkable.

Stomach/Bowel: Normal appendix. Within limitations of respiratory
motion no gross abnormalities of the stomach or bowel loops
identified.

Vascular/Lymphatic: Vascular structures grossly patent. No
adenopathy.

Reproductive: Significantly enlarged postpartum uterus.

Other: No free air or free fluid. Tiny umbilical hernia containing
fat.

Musculoskeletal: Osseous structures unremarkable.
IMPRESSION: Patchy LEFT nephrogram with minimal perinephric edema consistent
with pyelonephritis; recommend correlation with urinalysis.

Significantly enlarged postpartum uterus.

Tiny umbilical hernia containing fat.

Mild infiltrate versus atelectasis in posterior RIGHT lower lobe.

Minimal splenic enlargement.

Remainder of exam unremarkable.
